# Patient Record
Sex: Male | Born: 1965 | Race: White | Hispanic: No | Marital: Married | Smoking: Former smoker
Health system: Southern US, Community
[De-identification: ages and names within clinical notes are randomized; demographics above are authoritative.]

## PROBLEM LIST (undated history)

## (undated) DIAGNOSIS — J449 Chronic obstructive pulmonary disease, unspecified: Secondary | ICD-10-CM

## (undated) DIAGNOSIS — I10 Essential (primary) hypertension: Secondary | ICD-10-CM

## (undated) DIAGNOSIS — K219 Gastro-esophageal reflux disease without esophagitis: Secondary | ICD-10-CM

## (undated) DIAGNOSIS — E7800 Pure hypercholesterolemia, unspecified: Secondary | ICD-10-CM

## (undated) DIAGNOSIS — I2510 Atherosclerotic heart disease of native coronary artery without angina pectoris: Secondary | ICD-10-CM

## (undated) DIAGNOSIS — I509 Heart failure, unspecified: Secondary | ICD-10-CM

## (undated) HISTORY — PX: CORONARY ANGIOPLASTY WITH STENT PLACEMENT: SHX49

## (undated) HISTORY — DX: Gastro-esophageal reflux disease without esophagitis: K21.9

---

## 2018-02-01 ENCOUNTER — Encounter: Payer: Self-pay | Admitting: Emergency Medicine

## 2018-02-01 ENCOUNTER — Ambulatory Visit
Admission: EM | Admit: 2018-02-01 | Discharge: 2018-02-01 | Disposition: A | Discharge status: Home / Self Care | Payer: Medicaid Other | Attending: Family Medicine | Admitting: Family Medicine

## 2018-02-01 ENCOUNTER — Ambulatory Visit (INDEPENDENT_AMBULATORY_CARE_PROVIDER_SITE_OTHER): Payer: Medicaid Other

## 2018-02-01 ENCOUNTER — Other Ambulatory Visit: Payer: Self-pay

## 2018-02-01 DIAGNOSIS — Z79899 Other long term (current) drug therapy: Secondary | ICD-10-CM | POA: Insufficient documentation

## 2018-02-01 DIAGNOSIS — Z888 Allergy status to other drugs, medicaments and biological substances status: Secondary | ICD-10-CM | POA: Diagnosis not present

## 2018-02-01 DIAGNOSIS — I110 Hypertensive heart disease with heart failure: Secondary | ICD-10-CM | POA: Diagnosis not present

## 2018-02-01 DIAGNOSIS — F17200 Nicotine dependence, unspecified, uncomplicated: Secondary | ICD-10-CM | POA: Diagnosis not present

## 2018-02-01 DIAGNOSIS — R0781 Pleurodynia: Secondary | ICD-10-CM | POA: Diagnosis not present

## 2018-02-01 DIAGNOSIS — J441 Chronic obstructive pulmonary disease with (acute) exacerbation: Secondary | ICD-10-CM

## 2018-02-01 DIAGNOSIS — R110 Nausea: Secondary | ICD-10-CM

## 2018-02-01 DIAGNOSIS — R079 Chest pain, unspecified: Secondary | ICD-10-CM

## 2018-02-01 DIAGNOSIS — I2510 Atherosclerotic heart disease of native coronary artery without angina pectoris: Secondary | ICD-10-CM | POA: Diagnosis not present

## 2018-02-01 DIAGNOSIS — Z7982 Long term (current) use of aspirin: Secondary | ICD-10-CM | POA: Diagnosis not present

## 2018-02-01 DIAGNOSIS — E7800 Pure hypercholesterolemia, unspecified: Secondary | ICD-10-CM | POA: Insufficient documentation

## 2018-02-01 DIAGNOSIS — J449 Chronic obstructive pulmonary disease, unspecified: Secondary | ICD-10-CM | POA: Diagnosis not present

## 2018-02-01 DIAGNOSIS — R0602 Shortness of breath: Secondary | ICD-10-CM

## 2018-02-01 DIAGNOSIS — Z955 Presence of coronary angioplasty implant and graft: Secondary | ICD-10-CM | POA: Diagnosis not present

## 2018-02-01 DIAGNOSIS — I509 Heart failure, unspecified: Secondary | ICD-10-CM | POA: Insufficient documentation

## 2018-02-01 HISTORY — DX: Essential (primary) hypertension: I10

## 2018-02-01 HISTORY — DX: Pure hypercholesterolemia, unspecified: E78.00

## 2018-02-01 HISTORY — DX: Atherosclerotic heart disease of native coronary artery without angina pectoris: I25.10

## 2018-02-01 HISTORY — DX: Heart failure, unspecified: I50.9

## 2018-02-01 HISTORY — DX: Chronic obstructive pulmonary disease, unspecified: J44.9

## 2018-02-01 LAB — COMPREHENSIVE METABOLIC PANEL
ALBUMIN: 4 g/dL (ref 3.5–5.0)
ALK PHOS: 166 U/L — AB (ref 38–126)
ALT: 25 U/L (ref 0–44)
AST: 23 U/L (ref 15–41)
Anion gap: 11 (ref 5–15)
BILIRUBIN TOTAL: 0.5 mg/dL (ref 0.3–1.2)
BUN: 20 mg/dL (ref 6–20)
CALCIUM: 9.3 mg/dL (ref 8.9–10.3)
CO2: 25 mmol/L (ref 22–32)
CREATININE: 1.05 mg/dL (ref 0.61–1.24)
Chloride: 100 mmol/L (ref 98–111)
GFR calc Af Amer: >60 mL/min (ref >60)
GFR calc non Af Amer: >60 mL/min (ref >60)
GLUCOSE: 137 mg/dL — AB (ref 70–99)
Potassium: 4 mmol/L (ref 3.5–5.1)
SODIUM: 136 mmol/L (ref 135–145)
TOTAL PROTEIN: 7.6 g/dL (ref 6.5–8.1)

## 2018-02-01 LAB — CBC WITH DIFFERENTIAL/PLATELET
ABS IMMATURE GRANULOCYTES: 0.02 10*3/uL (ref 0.00–0.07)
BASOS ABS: 0.1 10*3/uL (ref 0.0–0.1)
Basophils Relative: 1 %
EOS PCT: 2 %
Eosinophils Absolute: 0.1 10*3/uL (ref 0.0–0.5)
HEMATOCRIT: 48.8 % (ref 39.0–52.0)
Hemoglobin: 16.4 g/dL (ref 13.0–17.0)
Immature Granulocytes: 0 %
LYMPHS ABS: 1.8 10*3/uL (ref 0.7–4.0)
Lymphocytes Relative: 25 %
MCH: 26.5 pg (ref 26.0–34.0)
MCHC: 33.6 g/dL (ref 30.0–36.0)
MCV: 79 fL — ABNORMAL LOW (ref 80.0–100.0)
MONO ABS: 0.5 10*3/uL (ref 0.1–1.0)
Monocytes Relative: 7 %
NEUTROS ABS: 4.5 10*3/uL (ref 1.7–7.7)
NRBC: 0 % (ref 0.0–0.2)
Neutrophils Relative %: 65 %
Platelets: 202 10*3/uL (ref 150–400)
RBC: 6.18 MIL/uL — AB (ref 4.22–5.81)
RDW: 13.6 % (ref 11.5–15.5)
WBC: 6.9 10*3/uL (ref 4.0–10.5)

## 2018-02-01 MED ORDER — UMECLIDINIUM-VILANTEROL 62.5-25 MCG/INH IN AEPB: 1.0000 | INHALATION_SPRAY | Freq: Every day | RESPIRATORY_TRACT | 0 refills | Status: DC

## 2018-02-01 MED ORDER — PREDNISONE 20 MG PO TABS: 20.0000 mg | ORAL_TABLET | Freq: Every day | ORAL | 0 refills | Status: DC

## 2018-02-01 NOTE — ED Triage Notes (Signed)
Patient c/o chest pain that radiates into his back that started 2 weeks ago. Patient c/o shortness of breath, nausea, sweating with chest pain episodes.

## 2018-02-25 ENCOUNTER — Ambulatory Visit: Payer: Self-pay

## 2018-03-04 ENCOUNTER — Other Ambulatory Visit: Payer: Self-pay

## 2018-03-04 ENCOUNTER — Encounter: Payer: Self-pay | Admitting: Gerontology

## 2018-03-04 ENCOUNTER — Ambulatory Visit: Payer: Self-pay | Admitting: Gerontology

## 2018-03-04 VITALS — BP 155/103 | HR 71 | Ht 71.0 in | Wt 188.6 lb

## 2018-03-04 DIAGNOSIS — Z7689 Persons encountering health services in other specified circumstances: Secondary | ICD-10-CM

## 2018-03-04 DIAGNOSIS — Z720 Tobacco use: Secondary | ICD-10-CM

## 2018-03-04 DIAGNOSIS — J449 Chronic obstructive pulmonary disease, unspecified: Secondary | ICD-10-CM

## 2018-03-04 DIAGNOSIS — Z8719 Personal history of other diseases of the digestive system: Secondary | ICD-10-CM

## 2018-03-04 DIAGNOSIS — I509 Heart failure, unspecified: Secondary | ICD-10-CM

## 2018-03-04 DIAGNOSIS — E785 Hyperlipidemia, unspecified: Secondary | ICD-10-CM

## 2018-03-04 DIAGNOSIS — I10 Essential (primary) hypertension: Secondary | ICD-10-CM

## 2018-03-04 MED ORDER — BUMETANIDE 1 MG PO TABS: 1.0000 mg | ORAL_TABLET | Freq: Every day | ORAL | 3 refills | Status: AC

## 2018-03-04 MED ORDER — ATORVASTATIN CALCIUM 80 MG PO TABS: 80.0000 mg | ORAL_TABLET | Freq: Every day | ORAL | 0 refills | Status: DC

## 2018-03-04 MED ORDER — CLONIDINE HCL 0.2 MG PO TABS: 0.2000 mg | ORAL_TABLET | Freq: Two times a day (BID) | ORAL | 2 refills | Status: DC

## 2018-03-04 MED ORDER — NITROGLYCERIN 0.4 MG SL SUBL: SUBLINGUAL_TABLET | SUBLINGUAL | 2 refills | Status: DC

## 2018-03-04 MED ORDER — OMEPRAZOLE 40 MG PO CPDR: 40.0000 mg | DELAYED_RELEASE_CAPSULE | Freq: Every day | ORAL | 3 refills | Status: DC

## 2018-03-04 MED ORDER — ASPIRIN 81 MG PO CHEW: 81.0000 mg | CHEWABLE_TABLET | Freq: Every day | ORAL | 3 refills | Status: DC

## 2018-03-04 MED ORDER — UMECLIDINIUM-VILANTEROL 62.5-25 MCG/INH IN AEPB: 1.0000 | INHALATION_SPRAY | Freq: Every day | RESPIRATORY_TRACT | 0 refills | Status: AC

## 2018-03-04 MED ORDER — CLOPIDOGREL BISULFATE 75 MG PO TABS: 75.0000 mg | ORAL_TABLET | Freq: Every day | ORAL | 2 refills | Status: AC

## 2018-03-04 MED ORDER — CARVEDILOL 25 MG PO TABS: 25.0000 mg | ORAL_TABLET | Freq: Two times a day (BID) | ORAL | 2 refills | Status: DC

## 2018-03-04 MED ORDER — BENAZEPRIL HCL 40 MG PO TABS: 40.0000 mg | ORAL_TABLET | Freq: Every day | ORAL | 3 refills | Status: DC

## 2018-03-04 MED ORDER — ALBUTEROL SULFATE HFA 108 (90 BASE) MCG/ACT IN AERS: 2.0000 | INHALATION_SPRAY | RESPIRATORY_TRACT | 3 refills | Status: AC | PRN

## 2018-03-04 NOTE — Patient Instructions (Signed)
Smoking Tobacco Information Smoking tobacco will very likely harm your health. Tobacco contains a poisonous (toxic), colorless chemical called nicotine. Nicotine affects the brain and makes tobacco addictive. This change in your brain can make it hard to stop smoking. Tobacco also has other toxic chemicals that can hurt your body and raise your risk of many cancers. How can smoking tobacco affect me? Smoking tobacco can increase your chances of having serious health conditions, such as:  Cancer. Smoking is most commonly associated with lung cancer, but can lead to cancer in other parts of the body.  Chronic obstructive pulmonary disease (COPD). This is a long-term lung condition that makes it hard to breathe. It also gets worse over time.  High blood pressure (hypertension), heart disease, stroke, or heart attack.  Lung infections, such as pneumonia.  Cataracts. This is when the lenses in the eyes become clouded.  Digestive problems. This may include peptic ulcers, heartburn, and gastroesophageal reflux disease (GERD).  Oral health problems, such as gum disease and tooth loss.  Loss of taste and smell.  Smoking can affect your appearance by causing:  Wrinkles.  Yellow or stained teeth, fingers, and fingernails.  Smoking tobacco can also affect your social life.  Many workplaces, restaurants, hotels, and public places are tobacco-free. This means that you may experience challenges in finding places to smoke when away from home.  The cost of a smoking habit can be expensive. Expenses for someone who smokes come in two ways: ? You spend money on a regular basis to buy tobacco. ? Your health care costs in the long-term are higher if you smoke.  Tobacco smoke can also affect the health of those around you. Children of smokers have greater chances of: ? Sudden infant death syndrome (SIDS). ? Ear infections. ? Lung infections.  What lifestyle changes can be made?  Do not start  smoking. Quit if you already do.  To quit smoking: ? Make a plan to quit smoking and commit yourself to it. Look for programs to help you and ask your health care provider for recommendations and ideas. ? Talk with your health care provider about using nicotine replacement medicines to help you quit. Medicine replacement medicines include gum, lozenges, patches, sprays, or pills. ? Do not replace cigarette smoking with electronic cigarettes, which are commonly called e-cigarettes. The safety of e-cigarettes is not known, and some may contain harmful chemicals. ? Avoid places, people, or situations that tempt you to smoke. ? If you try to quit but return to smoking, don't give up hope. It is very common for people to try a number of times before they fully succeed. When you feel ready again, give it another try.  Quitting smoking might affect the way you eat as well as your weight. Be prepared to monitor your eating habits. Get support in planning and following a healthy diet.  Ask your health care provider about having regular tests (screenings) to check for cancer. This may include blood tests, imaging tests, and other tests.  Exercise regularly. Consider taking walks, joining a gym, or doing yoga or exercise classes.  Develop skills to manage your stress. These skills include meditation. What are the benefits of quitting smoking? By quitting smoking, you may:  Lower your risk of getting cancer and other diseases caused by smoking.  Live longer.  Breathe better.  Lower your blood pressure and heart rate.  Stop your addiction to tobacco.  Stop creating secondhand smoke that hurts other people.  Improve your   sense of taste and smell.  Look better over time, due to having fewer wrinkles and less staining.  What can happen if changes are not made? If you do not stop smoking, you may:  Get cancer and other diseases.  Develop COPD or other long-term (chronic) lung  conditions.  Develop serious problems with your heart and blood vessels (cardiovascular system).  Need more tests to screen for problems caused by smoking.  Have higher, long-term healthcare costs from medicines or treatments related to smoking.  Continue to have worsening changes in your lungs, mouth, and nose.  Where to find support: To get support to quit smoking, consider:  Asking your health care provider for more information and resources.  Taking classes to learn more about quitting smoking.  Looking for local organizations that offer resources about quitting smoking.  Joining a support group for people who want to quit smoking in your local community.  Where to find more information: You may find more information about quitting smoking from:  HelpGuide.org: www.helpguide.org/articles/addictions/how-to-quit-smoking.htm  Smokefree.gov: smokefree.gov  American Lung Association: www.lung.org  Contact a health care provider if:  You have problems breathing.  Your lips, nose, or fingers turn blue.  You have chest pain.  You are coughing up blood.  You feel faint or you pass out.  You have other noticeable changes that cause you to worry. Summary  Smoking tobacco can negatively affect your health, the health of those around you, your finances, and your social life.  Do not start smoking. Quit if you already do. If you need help quitting, ask your health care provider.  Think about joining a support group for people who want to quit smoking in your local community. There are many effective programs that will help you to quit this behavior. This information is not intended to replace advice given to you by your health care provider. Make sure you discuss any questions you have with your health care provider. Document Released: 03/25/2016 Document Revised: 03/25/2016 Document Reviewed: 03/25/2016 Elsevier Interactive Patient Education  2018 Else[employer] Inc[employer] DASH  Eating Plan DASH stands for "Dietary Approaches to Stop Hypertension." The DASH eating plan is a healthy eating plan that has been shown to reduce high blood pressure (hypertension). It may also reduce your risk for type 2 diabetes, heart disease, and stroke. The DASH eating plan may also help with weight loss. What are tips for following this plan? General guidelines  Avoid eating more than 2,300 mg (milligrams) of salt (sodium) a day. If you have hypertension, you may need to reduce your sodium intake to 1,500 mg a day.  Limit alcohol intake to no more than 1 drink a day for nonpregnant women and 2 drinks a day for men. One drink equals 12 oz of beer, 5 oz of wine, or 1 oz of hard liquor.  Work with your health care provider to maintain a healthy body weight or to lose weight. Ask what an ideal weight is for you.  Get at least 30 minutes of exercise that causes your heart to beat faster (aerobic exercise) most days of the week. Activities may include walking, swimming, or biking.  Work with your health care provider or diet and nutrition specialist (dietitian) to adjust your eating plan to your individual calorie needs. Reading food labels  Check food labels for the amount of sodium per serving. Choose foods with less than 5 percent of the Daily Value of sodium. Generally, foods with less than 300 mg of sodium per serving   fit into this eating plan.  To find whole grains, look for the word "whole" as the first word in the ingredient list. Shopping  Buy products labeled as "low-sodium" or "no salt added."  Buy fresh foods. Avoid canned foods and premade or frozen meals. Cooking  Avoid adding salt when cooking. Use salt-free seasonings or herbs instead of table salt or sea salt. Check with your health care provider or pharmacist before using salt substitutes.  Do not fry foods. Cook foods using healthy methods such as baking, boiling, grilling, and broiling instead.  Cook with  heart-healthy oils, such as olive, canola, soybean, or sunflower oil. Meal planning   Eat a balanced diet that includes: ? 5 or more servings of fruits and vegetables each day. At each meal, try to fill half of your plate with fruits and vegetables. ? Up to 6-8 servings of whole grains each day. ? Less than 6 oz of lean meat, poultry, or fish each day. A 3-oz serving of meat is about the same size as a deck of cards. One egg equals 1 oz. ? 2 servings of low-fat dairy each day. ? A serving of nuts, seeds, or beans 5 times each week. ? Heart-healthy fats. Healthy fats called Omega-3 fatty acids are found in foods such as flaxseeds and coldwater fish, like sardines, salmon, and mackerel.  Limit how much you eat of the following: ? Canned or prepackaged foods. ? Food that is high in trans fat, such as fried foods. ? Food that is high in saturated fat, such as fatty meat. ? Sweets, desserts, sugary drinks, and other foods with added sugar. ? Full-fat dairy products.  Do not salt foods before eating.  Try to eat at least 2 vegetarian meals each week.  Eat more home-cooked food and less restaurant, buffet, and fast food.  When eating at a restaurant, ask that your food be prepared with less salt or no salt, if possible. What foods are recommended? The items listed may not be a complete list. Talk with your dietitian about what dietary choices are best for you. Grains Whole-grain or whole-wheat bread. Whole-grain or whole-wheat pasta. Brown rice. Oatmeal. Quinoa. Bulgur. Whole-grain and low-sodium cereals. Pita bread. Low-fat, low-sodium crackers. Whole-wheat flour tortillas. Vegetables Fresh or frozen vegetables (raw, steamed, roasted, or grilled). Low-sodium or reduced-sodium tomato and vegetable juice. Low-sodium or reduced-sodium tomato sauce and tomato paste. Low-sodium or reduced-sodium canned vegetables. Fruits All fresh, dried, or frozen fruit. Canned fruit in natural juice (without  added sugar). Meat and other protein foods Skinless chicken or turkey. Ground chicken or turkey. Pork with fat trimmed off. Fish and seafood. Egg whites. Dried beans, peas, or lentils. Unsalted nuts, nut butters, and seeds. Unsalted canned beans. Lean cuts of beef with fat trimmed off. Low-sodium, lean deli meat. Dairy Low-fat (1%) or fat-free (skim) milk. Fat-free, low-fat, or reduced-fat cheeses. Nonfat, low-sodium ricotta or cottage cheese. Low-fat or nonfat yogurt. Low-fat, low-sodium cheese. Fats and oils Soft margarine without trans fats. Vegetable oil. Low-fat, reduced-fat, or light mayonnaise and salad dressings (reduced-sodium). Canola, safflower, olive, soybean, and sunflower oils. Avocado. Seasoning and other foods Herbs. Spices. Seasoning mixes without salt. Unsalted popcorn and pretzels. Fat-free sweets. What foods are not recommended? The items listed may not be a complete list. Talk with your dietitian about what dietary choices are best for you. Grains Baked goods made with fat, such as croissants, muffins, or some breads. Dry pasta or rice meal packs. Vegetables Creamed or fried vegetables. Vegetables in   a cheese sauce. Regular canned vegetables (not low-sodium or reduced-sodium). Regular canned tomato sauce and paste (not low-sodium or reduced-sodium). Regular tomato and vegetable juice (not low-sodium or reduced-sodium). Pickles. Olives. Fruits Canned fruit in a light or heavy syrup. Fried fruit. Fruit in cream or butter sauce. Meat and other protein foods Fatty cuts of meat. Ribs. Fried meat. Bacon. Sausage. Bologna and other processed lunch meats. Salami. Fatback. Hotdogs. Bratwurst. Salted nuts and seeds. Canned beans with added salt. Canned or smoked fish. Whole eggs or egg yolks. Chicken or turkey with skin. Dairy Whole or 2% milk, cream, and half-and-half. Whole or full-fat cream cheese. Whole-fat or sweetened yogurt. Full-fat cheese. Nondairy creamers. Whipped toppings.  Processed cheese and cheese spreads. Fats and oils Butter. Stick margarine. Lard. Shortening. Ghee. Bacon fat. Tropical oils, such as coconut, palm kernel, or palm oil. Seasoning and other foods Salted popcorn and pretzels. Onion salt, garlic salt, seasoned salt, table salt, and sea salt. Worcestershire sauce. Tartar sauce. Barbecue sauce. Teriyaki sauce. Soy sauce, including reduced-sodium. Steak sauce. Canned and packaged gravies. Fish sauce. Oyster sauce. Cocktail sauce. Horseradish that you find on the shelf. Ketchup. Mustard. Meat flavorings and tenderizers. Bouillon cubes. Hot sauce and Tabasco sauce. Premade or packaged marinades. Premade or packaged taco seasonings. Relishes. Regular salad dressings. Where to find more information:  National Heart, Lung, and Blood Institute: www.nhlbi.nih.gov  American Heart Association: www.heart.org Summary  The DASH eating plan is a healthy eating plan that has been shown to reduce high blood pressure (hypertension). It may also reduce your risk for type 2 diabetes, heart disease, and stroke.  With the DASH eating plan, you should limit salt (sodium) intake to 2,300 mg a day. If you have hypertension, you may need to reduce your sodium intake to 1,500 mg a day.  When on the DASH eating plan, aim to eat more fresh fruits and vegetables, whole grains, lean proteins, low-fat dairy, and heart-healthy fats.  Work with your health care provider or diet and nutrition specialist (dietitian) to adjust your eating plan to your individual calorie needs. This information is not intended to replace advice given to you by your health care provider. Make sure you discuss any questions you have with your health care provider. Document Released: 02/27/2011 Document Revised: 03/03/2016 Document Reviewed: 03/03/2016 Elsevier Interactive Patient Education  2018 Elsevier Inc.  

## 2018-03-04 NOTE — Progress Notes (Signed)
Patient: Thomas Bautista Male    DOB: 07-Nov-1965   52 y.o.   MRN: [id number] Visit Date: 03/04/2018  Today's Provider: Rolm Galahioma E Harlee Pursifull, NP   Chief Complaint  Patient presents with  . New Patient (Initial Visit)    establish primary care for high blood pressure, would like referral to cardiologist   Subjective:    HPI Thomas Bautista 52 y/o male presents for initial office visit and evaluate hypertension and medication refill. He was seen at the ED  for chest pain on 12/02/17, he has a history of CAD with 7 stents placed between 2015-2018, and continues to smoke 1 pack of cigarette a day and admits the desire to quit.  He denies chest pain, palpitations, dizziness and he monitors BP at home. He reports that he has being out of Lotensin for 2 weeks and clonidine for 2 days.  He c/o intermittent swelling to right foot, and discoloration.  He has a history of COPD and uses albuterol as needed.       Allergies  Allergen Reactions  . Furosemide Shortness Of Breath  . Spironolactone     Reported extreme tachycardia   Previous Medications   ALBUTEROL (PROVENTIL HFA;VENTOLIN HFA) 108 (90 BASE) MCG/ACT INHALER    Inhale into the lungs.   ASPIRIN 81 MG CHEWABLE TABLET    Chew by mouth.   ATORVASTATIN (LIPITOR) 80 MG TABLET    Take by mouth.   BENAZEPRIL (LOTENSIN) 40 MG TABLET    Take by mouth.   BUMETANIDE (BUMEX) 1 MG TABLET    Take by mouth.   CARVEDILOL (COREG) 25 MG TABLET    Take by mouth.   CLONIDINE (CATAPRES) 0.2 MG TABLET    Take by mouth.   CLOPIDOGREL (PLAVIX) 75 MG TABLET    Take 75 mg by mouth daily.   ESCITALOPRAM (LEXAPRO) 20 MG TABLET    Take by mouth.   NITROGLYCERIN (NITROSTAT) 0.4 MG SL TABLET    0.4 mg. Frequency:PHARMDIR   Dosage:0.4   MG  Instructions:  Note:Take 1 tab under the tongue for chest pain, you can take another after 5 minutes if not relieved, up to 3 tabs in 15 mins. If not relieved, call your doctor or go to the ER. Dose: 0.4 MG   OMEPRAZOLE (PRILOSEC)  40 MG CAPSULE    Take by mouth.   UMECLIDINIUM-VILANTEROL (ANORO ELLIPTA) 62.5-25 MCG/INH AEPB    Inhale 1 puff into the lungs daily.    Review of Systems  Constitutional: Negative.   HENT: Negative.   Eyes: Negative.   Respiratory: Negative.   Cardiovascular: Negative.   Gastrointestinal: Positive for constipation (reports doesn't drink water).  Genitourinary: Negative.   Musculoskeletal: Negative.   Skin: Negative.   Neurological: Negative.  Negative for dizziness and headaches.  Psychiatric/Behavioral: Negative.     Social History   Tobacco Use  . Smoking status: Current Every Day Smoker  . Smokeless tobacco: Never Used  Substance Use Topics  . Alcohol use: Never    Frequency: Never   Objective:   BP (!) 155/103 (BP Location: Right Arm, Patient Position: Sitting)   Pulse 71   Ht 5\' 11"  (1.803 m)   Wt 188 lb 9.6 oz (85.5 kg)   SpO2 97%   BMI 26.30 kg/m   Physical Exam Constitutional:      Appearance: Normal appearance.  HENT:     Head: Normocephalic and atraumatic.  Eyes:     Pupils: Pupils are equal, round, and reactive to light.  Neck:     Musculoskeletal: Normal range of motion.  Cardiovascular:     Rate and Rhythm: Normal rate and regular rhythm.     Pulses: Normal pulses.     Heart sounds: Normal heart sounds.  Pulmonary:     Effort: Pulmonary effort is normal.     Breath sounds: Normal breath sounds.  Abdominal:     General: Abdomen is flat. Bowel sounds are normal.     Palpations: Abdomen is soft.  Musculoskeletal: Normal range of motion.  Skin:    General: Skin is warm and dry.  Neurological:     General: No focal deficit present.     Mental Status: He is alert and oriented to person, place, and time.  Psychiatric:        Mood and Affect: Mood normal.        Behavior: Behavior normal.        Thought Content: Thought content normal.        Judgment: Judgment normal.         Assessment & Plan:     1. Essential hypertension - Mr.  Minion will continue on the following blood pressure medicine, he was encouraged to monitor and document BP, continue on low salt diet, increase water intake. - benazepril (LOTENSIN) 40 MG tablet; Take 1 tablet (40 mg total) by mouth daily.  Dispense: 30 tablet; Refill: 3 - bumetanide (BUMEX) 1 MG tablet; Take 1 tablet (1 mg total) by mouth daily.  Dispense: 30 tablet; Refill: 3 - carvedilol (COREG) 25 MG tablet; Take 1 tablet (25 mg total) by mouth 2 (two) times daily with a meal.  Dispense: 60 tablet; Refill: 2 - cloNIDine (CATAPRES) 0.2 MG tablet; Take 1 tablet (0.2 mg total) by mouth 2 (two) times daily.  Dispense: 60 tablet; Refill: 2 - clopidogrel (PLAVIX) 75 MG tablet; Take 1 tablet (75 mg total) by mouth daily.  Dispense: 30 tablet; Refill: 2  2. Hyperlipidemia, unspecified hyperlipidemia type -He was advised to continue on low fat low cholesterol diet. - atorvastatin (LIPITOR) 80 MG tablet; Take 1 tablet (80 mg total) by mouth daily at 6 PM.  Dispense: 30 tablet; Refill: 0 - Lipid Profile  3. Tobacco abuse disorder - He was strongly advised on smoking cessation  4. Congestive heart failure, unspecified HF chronicity, unspecified heart failure type (HCC) - Charity care application provided for Cardiology referral - aspirin 81 MG chewable tablet; Chew 1 tablet (81 mg total) by mouth daily.  Dispense: 30 tablet; Refill: 3 - nitroGLYCERIN (NITROSTAT) 0.4 MG SL tablet; 0.4 mg. Frequency:PHARMDIR   Dosage:0.4   MG  Instructions:  Note:Take 1 tab under the tongue for chest pain, you can take another after 5 minutes if not relieved, up to 3 tabs in 15 mins. If not relieved, call your doctor or go to the ER. Dose: 0.4 MG  Dispense: 30 tablet; Refill: 2  5. Encounter to establish care - He was strongly advised to increase water intake and fiber for constipation. - HgB A1c  6. 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  Dispense: 1 Inhaler; Refill: 3 - umeclidinium-vilanterol (ANORO ELLIPTA) 62.5-25 MCG/INH AEPB; Inhale 1 puff into the lungs daily.  Dispense: 1 each; Refill: 0  7. H/O gastroesophageal reflux (GERD)  - omeprazole (PRILOSEC) 40 MG capsule; Take 1  capsule (40 mg total) by mouth daily.  Dispense: 30 capsule; Refill: 3       Evolet Salminen Trellis Paganini, NP   [facility]

## 2018-03-08 ENCOUNTER — Ambulatory Visit: Payer: 59

## 2018-03-10 ENCOUNTER — Other Ambulatory Visit: Payer: Self-pay | Admitting: Gerontology

## 2018-03-10 ENCOUNTER — Other Ambulatory Visit: Payer: Self-pay

## 2018-03-10 DIAGNOSIS — I10 Essential (primary) hypertension: Secondary | ICD-10-CM

## 2018-03-10 DIAGNOSIS — I509 Heart failure, unspecified: Secondary | ICD-10-CM

## 2018-03-10 DIAGNOSIS — Z8719 Personal history of other diseases of the digestive system: Secondary | ICD-10-CM

## 2018-03-10 MED ORDER — NITROGLYCERIN 0.4 MG SL SUBL: SUBLINGUAL_TABLET | SUBLINGUAL | 2 refills | Status: DC

## 2018-03-10 MED ORDER — PANTOPRAZOLE SODIUM 40 MG PO TBEC: 40.0000 mg | DELAYED_RELEASE_TABLET | Freq: Every day | ORAL | 0 refills | Status: DC

## 2018-03-10 MED ORDER — NITROGLYCERIN 0.4 MG SL SUBL: SUBLINGUAL_TABLET | SUBLINGUAL | 2 refills | Status: AC

## 2018-03-12 ENCOUNTER — Ambulatory Visit: Payer: 59

## 2018-03-30 ENCOUNTER — Ambulatory Visit: Payer: Self-pay | Admitting: Pharmacy Technician

## 2018-03-30 DIAGNOSIS — Z79899 Other long term (current) drug therapy: Secondary | ICD-10-CM

## 2018-03-30 NOTE — Progress Notes (Signed)
Patient unable to find transportation.  Requested phone consult.  Read contract over phone.  Patient verbally accepted all aspects of [facility]'s contract.  Mailing contract for patient to sign and return to [facility].  Pt indicated that he had an [employer] through [place] when he lived in [place].  Policy ended when he moved to [facility]  No longer has any form of health insurance.    Referred patient for MTM.  Completing Albuterol PAP Application.  Mailing to patient and provider to obtain signature.  Once returned will submit to [employer].  Sherilyn Dacosta Care Manager [facility]

## 2018-04-02 ENCOUNTER — Other Ambulatory Visit: Payer: Self-pay

## 2018-04-02 DIAGNOSIS — E785 Hyperlipidemia, unspecified: Secondary | ICD-10-CM

## 2018-04-02 MED ORDER — ATORVASTATIN CALCIUM 80 MG PO TABS: 80.0000 mg | ORAL_TABLET | Freq: Every day | ORAL | 0 refills | Status: DC

## 2018-04-08 ENCOUNTER — Ambulatory Visit: Payer: Self-pay | Admitting: Gerontology

## 2018-04-11 NOTE — ED Provider Notes (Signed)
[facility]    CSN: [id number] Arrival date & time: 02/01/18  1128     History   Chief Complaint Chief Complaint  Patient presents with  . Chest Pain    HPI Thomas Bautista is a 53 y.o. male.   53 yo male with a c/o chest pain for the past 2 weeks. States pain is worse when taking deep breaths or with movement and feels short of breath and sweats when he experiences these episodes. Patient has a h/o COPD.   The history is provided by the patient.  Chest Pain    Past Medical History:  Diagnosis Date  . Congestive heart failure (CHF) (HCC)   . COPD (chronic obstructive pulmonary disease) (HCC)   . Coronary artery disease   . Hypercholesterolemia   . Hypertension     There are no active problems to display for this patient.   Past Surgical History:  Procedure Laterality Date  . CORONARY ANGIOPLASTY WITH STENT PLACEMENT     7 cardiac stents       Home Medications    Prior to Admission medications   Medication Sig Start Date End Date Taking? Authorizing Provider  escitalopram (LEXAPRO) 20 MG tablet Take by mouth.   Yes [provider]  albuterol (PROVENTIL HFA;VENTOLIN HFA) 108 (90 Base) MCG/ACT inhaler Inhale 2 puffs into the lungs every 4 (four) hours as needed for wheezing or shortness of breath. 03/04/18   Iloabachie, Chioma E, NP  aspirin 81 MG chewable tablet Chew 1 tablet (81 mg total) by mouth daily. 03/04/18   Iloabachie, Chioma E, NP  atorvastatin (LIPITOR) 80 MG tablet Take 1 tablet (80 mg total) by mouth daily at 6 PM. 04/02/18   Iloabachie, Chioma E, NP  benazepril (LOTENSIN) 40 MG tablet Take 1 tablet (40 mg total) by mouth daily. 03/04/18   Iloabachie, Chioma E, NP  bumetanide (BUMEX) 1 MG tablet Take 1 tablet (1 mg total) by mouth daily. 03/04/18   Iloabachie, Chioma E, NP  carvedilol (COREG) 25 MG tablet Take 1 tablet (25 mg total) by mouth 2 (two) times daily with a meal. 03/04/18   Iloabachie, Chioma E, NP  cloNIDine (CATAPRES)  0.2 MG tablet Take 1 tablet (0.2 mg total) by mouth 2 (two) times daily. 03/04/18   Iloabachie, Chioma E, NP  clopidogrel (PLAVIX) 75 MG tablet Take 1 tablet (75 mg total) by mouth daily. 03/04/18   Iloabachie, Chioma E, NP  nitroGLYCERIN (NITROSTAT) 0.4 MG SL tablet 0.4 mg. Frequency:PHARMDIR   Dosage:0.4   MG  Instructions:  Note:Take 1 tab under the tongue for chest pain, you can take another after 5 minutes if not relieved, up to 3 tabs in 15 mins. If not relieved, call your doctor or go to the ER. Dose: 0.4 MG 03/10/18   Iloabachie, Chioma E, NP  pantoprazole (PROTONIX) 40 MG tablet Take 1 tablet (40 mg total) by mouth daily. 03/10/18   Iloabachie, Chioma E, NP  umeclidinium-vilanterol (ANORO ELLIPTA) 62.5-25 MCG/INH AEPB Inhale 1 puff into the lungs daily. 03/04/18   Iloabachie, Chioma E, NP    Family History Family History  Problem Relation Age of Onset  . Cancer Mother   . Coronary artery disease Mother   . Coronary artery disease Father     Social History Social History   Tobacco Use  . Smoking status: Current Every Day Smoker  . Smokeless tobacco: Never Used  Substance Use Topics  . Alcohol use: Never    Frequency: Never  .  Drug use: Yes    Types: Marijuana    Comment: 1 month ago     Allergies   Furosemide and Spironolactone   Review of Systems Review of Systems  Cardiovascular: Positive for chest pain.     Physical Exam Triage Vital Signs ED Triage Vitals  Enc Vitals Group     BP 02/01/18 1147 (!) 148/104     Pulse Rate 02/01/18 1147 69     Resp 02/01/18 1147 18     Temp 02/01/18 1147 98.8 F (37.1 C)     Temp Source 02/01/18 1147 Oral     SpO2 02/01/18 1147 100 %     Weight 02/01/18 1139 170 lb (77.1 kg)     Height 02/01/18 1139 5\' 11"  (1.803 m)     Head Circumference --      Peak Flow --      Pain Score 02/01/18 1139 8     Pain Loc --      Pain Edu? --      Excl. in GC? --    No data found.  Updated Vital Signs BP (!) 148/104 (BP Location:  Left Arm)   Pulse 69   Temp 98.8 F (37.1 C) (Oral)   Resp 18   Ht 5\' 11"  (1.803 m)   Wt 77.1 kg   SpO2 100%   BMI 23.71 kg/m   Visual Acuity Right Eye Distance:   Left Eye Distance:   Bilateral Distance:    Right Eye Near:   Left Eye Near:    Bilateral Near:     Physical Exam Vitals signs and nursing note reviewed.  Constitutional:      General: He is not in acute distress.    Appearance: Normal appearance. He is well-developed. He is not ill-appearing, toxic-appearing or diaphoretic.  HENT:     Head: Normocephalic and atraumatic.     Mouth/Throat:     Pharynx: Uvula midline.     Tonsils: No tonsillar abscesses.  Neck:     Musculoskeletal: Normal range of motion and neck supple.     Thyroid: No thyromegaly.     Trachea: No tracheal deviation.  Cardiovascular:     Rate and Rhythm: Normal rate and regular rhythm.     Heart sounds: Normal heart sounds.  Pulmonary:     Effort: Pulmonary effort is normal. No respiratory distress.     Breath sounds: Normal breath sounds. No stridor. No wheezing, rhonchi or rales.  Chest:     Chest wall: No tenderness.  Abdominal:     General: Bowel sounds are normal. There is no distension.     Palpations: Abdomen is soft.     Tenderness: There is no abdominal tenderness.  Lymphadenopathy:     Cervical: No cervical adenopathy.  Skin:    General: Skin is warm and dry.     Findings: No rash.  Neurological:     Mental Status: He is alert.      UC Treatments / Results  Labs (all labs ordered are listed, but only abnormal results are displayed) Labs Reviewed  CBC WITH DIFFERENTIAL/PLATELET - Abnormal; Notable for the following components:      Result Value   RBC 6.18 (*)    MCV 79.0 (*)    All other components within normal limits  COMPREHENSIVE METABOLIC PANEL - Abnormal; Notable for the following components:   Glucose, Bld 137 (*)    Alkaline Phosphatase 166 (*)    All other components within normal limits  EKG None  Radiology No results found.  Procedures Procedures (including critical care time)  Medications Ordered in UC Medications - No data to display  Initial Impression / Assessment and Plan / UC Course  I have reviewed the triage vital signs and the nursing notes.  Pertinent labs & imaging results that were available during my care of the patient were reviewed by me and considered in my medical decision making (see chart for details).      Final Clinical Impressions(s) / UC Diagnoses   Final diagnoses:  Pleuritic chest pain  COPD exacerbation [facility](HCC)    ED Prescriptions    Medication Sig Dispense Auth. Provider   umeclidinium-vilanterol (ANORO ELLIPTA) 62.5-25 MCG/INH AEPB Inhale 1 puff into the lungs daily. 1 each Payton Mccallumonty, Ashira Kirsten, MD   predniSONE (DELTASONE) 20 MG tablet Take 1 tablet (20 mg total) by mouth daily. 7 tablet Payton Mccallumonty, Parsa Rickett, MD     1. Labs/x-ray results and diagnosis reviewed with patient 2. rx as per orders above; reviewed possible side effects, interactions, risks and benefits  3. Recommend supportive treatment with otc analgesics prn  4. Follow-up with PCP  5. Follow up prn if symptoms worsen or don't improve   Controlled Substance Prescriptions [place] Controlled Substance Registry consulted? Not Applicable   Payton Mccallumonty, Natalin Bible, MD 04/11/18 1655

## 2018-04-16 ENCOUNTER — Ambulatory Visit: Payer: 59 | Admitting: Pharmacist

## 2018-04-16 ENCOUNTER — Encounter: Payer: Self-pay | Admitting: Pharmacist

## 2018-04-16 ENCOUNTER — Other Ambulatory Visit: Payer: Self-pay

## 2018-04-16 VITALS — BP 150/90 | Wt 198.0 lb

## 2018-04-16 DIAGNOSIS — Z79899 Other long term (current) drug therapy: Secondary | ICD-10-CM

## 2018-04-16 NOTE — Progress Notes (Signed)
Medication Management Clinic Visit Note  Patient: Thomas Bautista MRN: [id number] Date of Birth: 05/08/1965 PCP: Patient, No Pcp Per   Gasper Sells 53 y.o. male presents for a medication management visit today.  BP (!) 150/90 (BP Location: Right Arm, Patient Position: Sitting, Cuff Size: Normal)   Wt 198 lb (89.8 kg)   BMI 27.62 kg/m   Patient Information   Past Medical History:  Diagnosis Date  . Congestive heart failure (CHF) (HCC)   . COPD (chronic obstructive pulmonary disease) (HCC)   . Coronary artery disease   . GERD (gastroesophageal reflux disease)   . Hypercholesterolemia   . Hypertension       Past Surgical History:  Procedure Laterality Date  . CORONARY ANGIOPLASTY WITH STENT PLACEMENT     7 cardiac stents     Family History  Problem Relation Age of Onset  . Cancer Mother   . Coronary artery disease Mother   . Coronary artery disease Father     Family Support: Good  Lifestyle Diet: Breakfast: cup of coffee Lunch: sandwich Dinner: cook (meat, vegetables) Drinks: sweet tea    Current Exercise Habits: The patient does not participate in regular exercise at present       Social History   Substance and Sexual Activity  Alcohol Use Never  . Frequency: Never      Social History   Tobacco Use  Smoking Status Current Every Day Smoker  . Packs/day: 1.00  Smokeless Tobacco Never Used  Tobacco Comment   Cutting back      Health Maintenance  Topic Date Due  . HIV Screening  06/14/1979  . TETANUS/TDAP  06/14/1983  . COLONOSCOPY  06/14/2014  . INFLUENZA VACCINE  Completed   Outpatient Encounter Medications as of 04/16/2018  Medication Sig  . albuterol (PROVENTIL HFA;VENTOLIN HFA) 108 (90 Base) MCG/ACT inhaler Inhale 2 puffs into the lungs every 4 (four) hours as needed for wheezing or shortness of breath.  Marland Kitchen aspirin 81 MG chewable tablet Chew 1 tablet (81 mg total) by mouth daily.  Marland Kitchen atorvastatin (LIPITOR) 80 MG tablet Take 1 tablet (80  mg total) by mouth daily at 6 PM.  . benazepril (LOTENSIN) 40 MG tablet Take 1 tablet (40 mg total) by mouth daily.  . bumetanide (BUMEX) 1 MG tablet Take 1 tablet (1 mg total) by mouth daily.  . carvedilol (COREG) 25 MG tablet Take 1 tablet (25 mg total) by mouth 2 (two) times daily with a meal.  . cloNIDine (CATAPRES) 0.2 MG tablet Take 1 tablet (0.2 mg total) by mouth 2 (two) times daily.  . clopidogrel (PLAVIX) 75 MG tablet Take 1 tablet (75 mg total) by mouth daily.  Marland Kitchen escitalopram (LEXAPRO) 20 MG tablet Take by mouth.  . isosorbide mononitrate (IMDUR) 30 MG 24 hr tablet Take 30 mg by mouth daily.  . nitroGLYCERIN (NITROSTAT) 0.4 MG SL tablet 0.4 mg. Frequency:PHARMDIR   Dosage:0.4   MG  Instructions:  Note:Take 1 tab under the tongue for chest pain, you can take another after 5 minutes if not relieved, up to 3 tabs in 15 mins. If not relieved, call your doctor or go to the ER. Dose: 0.4 MG  . omeprazole (PRILOSEC) 40 MG capsule Take 40 mg by mouth daily.  Marland Kitchen umeclidinium-vilanterol (ANORO ELLIPTA) 62.5-25 MCG/INH AEPB Inhale 1 puff into the lungs daily.  . [DISCONTINUED] pantoprazole (PROTONIX) 40 MG tablet Take 1 tablet (40 mg total) by mouth daily.   No facility-administered encounter medications on file as of  04/16/2018.    Health Maintenance/Date Completed  Last ED visit: September Last Visit to PCP: this month Next Visit to PCP: February Specialist Visit: cardiology Dental Exam: not in last 10 years Eye Exam: no Prostate Exam: no Pelvic/PAP Exam: NA Mammogram: NA DEXA: NA Colonoscopy: next month Flu Vaccine: yes Pneumonia Vaccine: yes   Assessment and Plan: HTN: benazepril, clonidine, bumetanide, carvedilol, isosorbide. Can tell when his BP is high, gets a tightness/pain in his head and neck. He checks his BP TID per request by PCP. Says it runs high (160s-190s). BP today was elevated. I asked him if his doctor gave him a BP "number" for when he needed to go to the ED. He  said yes but he doesn't do that. We discussed the implications of high blood pressure, including stroke. He does continue to smoke but has cut back.  HF: benazepril, bumetanide, carvedilol. Does notice swelling in legs but bumetanide does work to keep fluid off. Does not report any other issues. Has seen a cardiologist recently.  HLD: atorvastatin. Does not report any issues. Has not had lipid panel checked in a while.  COPD: Anoro and albuterol. Does not have Anoro right now. Uses albuterol once a day which seems to help. Admits he needs to stop smoking. Reports that PCP was going to switch him to Spiriva because he could not afford Anoro. [facility]. Had him fill out paperwork today to send to provider and PAP.  GERD: omeprazole. Does not have any symptoms. But does notice when he is out of it, he wakes up with symptoms (sick to stomach). No other issues.  Smoking cessation: has cut back to 1 PPD. Admits he needs to quit for his health, especially to help with his HTN and COPD. He is not working so he said being at home all day makes him smoke more. We discussed options to stop. He says he has the Nicotrol inhaler but that it doesn't really work for him. I spoke to him about the patches and gum or lozenges and that these could be used together. I also informed him that there are medications that he could take and to speak to his PCP if he would like to try one of those.  Compliance: does not miss doses. His significant other was with him today and she takes care of his medications for him. It was evident that he relies on her and that he is very appreciative of her help.  I will have him return in 6 months to follow up on getting his inhaler for him. After that, I think it will be reasonable to follow up yearly.  Pricilla RiffleAbby K Ellington, PharmD Pharmacy Resident

## 2018-04-19 ENCOUNTER — Telehealth: Payer: Self-pay | Admitting: Pharmacist

## 2018-04-19 NOTE — Telephone Encounter (Signed)
04/19/2018 3:22:23 PM - Ventolin faxed to [facility]  04/19/2018 Faxed [facility] application for enrollment on Ventolin HFA.Forde Radon

## 2018-04-21 ENCOUNTER — Other Ambulatory Visit: Payer: Self-pay | Admitting: Gerontology

## 2018-04-21 DIAGNOSIS — E785 Hyperlipidemia, unspecified: Secondary | ICD-10-CM

## 2018-04-21 DIAGNOSIS — I10 Essential (primary) hypertension: Secondary | ICD-10-CM

## 2018-04-21 DIAGNOSIS — Z8719 Personal history of other diseases of the digestive system: Secondary | ICD-10-CM

## 2018-05-04 ENCOUNTER — Telehealth: Payer: Self-pay | Admitting: Pharmacist

## 2018-05-04 NOTE — Telephone Encounter (Signed)
05/04/2018 8:26:29 AM - Kizzie Fantasia Ellipta refill  05/04/2018 Faxed [facility] script for refill on Anora Ellipta 62.5/25mcg Inhale 1 puff into the lungs once a day, every day.Forde Radon

## 2018-05-05 MED ORDER — MELATONIN 3 MG PO TABS
3.00 | ORAL_TABLET | ORAL | Status: DC
Start: ? — End: 2018-05-05

## 2018-05-05 MED ORDER — CLONIDINE HCL 0.1 MG PO TABS
.20 | ORAL_TABLET | ORAL | Status: DC
Start: 2018-05-05 — End: 2018-05-05

## 2018-05-05 MED ORDER — BUMETANIDE 1 MG PO TABS
1.00 | ORAL_TABLET | ORAL | Status: DC
Start: 2018-05-06 — End: 2018-05-05

## 2018-05-05 MED ORDER — CLOPIDOGREL BISULFATE 75 MG PO TABS
75.00 | ORAL_TABLET | ORAL | Status: DC
Start: 2018-05-06 — End: 2018-05-05

## 2018-05-05 MED ORDER — ATORVASTATIN CALCIUM 80 MG PO TABS
80.00 | ORAL_TABLET | ORAL | Status: DC
Start: 2018-05-05 — End: 2018-05-05

## 2018-05-05 MED ORDER — UMECLIDINIUM BROMIDE 62.5 MCG/INH IN AEPB
1.00 | INHALATION_SPRAY | RESPIRATORY_TRACT | Status: DC
Start: 2018-05-06 — End: 2018-05-05

## 2018-05-05 MED ORDER — ALBUTEROL SULFATE HFA 108 (90 BASE) MCG/ACT IN AERS
2.00 | INHALATION_SPRAY | RESPIRATORY_TRACT | Status: DC
Start: ? — End: 2018-05-05

## 2018-05-05 MED ORDER — ASPIRIN 81 MG PO CHEW
81.00 | CHEWABLE_TABLET | ORAL | Status: DC
Start: 2018-05-06 — End: 2018-05-05

## 2018-05-05 MED ORDER — ISOSORBIDE MONONITRATE ER 30 MG PO TB24
30.00 | ORAL_TABLET | ORAL | Status: DC
Start: 2018-05-06 — End: 2018-05-05

## 2018-05-05 MED ORDER — PANTOPRAZOLE SODIUM 40 MG PO TBEC
40.00 | DELAYED_RELEASE_TABLET | ORAL | Status: DC
Start: 2018-05-06 — End: 2018-05-05

## 2018-05-05 MED ORDER — GENERIC EXTERNAL MEDICATION
Status: DC
Start: ? — End: 2018-05-05

## 2018-05-05 MED ORDER — LISINOPRIL 40 MG PO TABS
40.00 | ORAL_TABLET | ORAL | Status: DC
Start: 2018-05-05 — End: 2018-05-05

## 2018-05-05 MED ORDER — ONDANSETRON 4 MG PO TBDP
4.00 | ORAL_TABLET | ORAL | Status: DC
Start: ? — End: 2018-05-05

## 2018-05-05 MED ORDER — NITROGLYCERIN 0.4 MG SL SUBL
.40 | SUBLINGUAL_TABLET | SUBLINGUAL | Status: DC
Start: ? — End: 2018-05-05

## 2018-05-05 MED ORDER — ESCITALOPRAM OXALATE 10 MG PO TABS
20.00 | ORAL_TABLET | ORAL | Status: DC
Start: 2018-05-06 — End: 2018-05-05

## 2018-05-05 MED ORDER — MORPHINE SULFATE 2 MG/ML IJ SOLN
2.00 | INTRAMUSCULAR | Status: DC
Start: ? — End: 2018-05-05

## 2018-05-05 MED ORDER — CARVEDILOL 25 MG PO TABS
25.00 | ORAL_TABLET | ORAL | Status: DC
Start: 2018-05-05 — End: 2018-05-05

## 2018-05-05 MED ORDER — ACETAMINOPHEN 325 MG PO TABS
650.00 | ORAL_TABLET | ORAL | Status: DC
Start: ? — End: 2018-05-05

## 2018-05-11 ENCOUNTER — Encounter

## 2018-05-11 ENCOUNTER — Other Ambulatory Visit: Payer: Self-pay | Admitting: Gerontology

## 2018-05-11 ENCOUNTER — Ambulatory Visit: Payer: 59 | Admitting: Cardiovascular Disease

## 2018-05-11 DIAGNOSIS — I10 Essential (primary) hypertension: Secondary | ICD-10-CM

## 2018-05-13 ENCOUNTER — Encounter: Payer: Self-pay | Admitting: Cardiovascular Disease

## 2018-05-17 ENCOUNTER — Other Ambulatory Visit: Payer: Self-pay | Admitting: Gerontology

## 2018-05-17 DIAGNOSIS — I10 Essential (primary) hypertension: Secondary | ICD-10-CM

## 2018-05-31 ENCOUNTER — Telehealth: Payer: Self-pay | Admitting: Pharmacist

## 2018-05-31 NOTE — Telephone Encounter (Signed)
05/31/2018 1:34:14 PM - Ventolin refill  05/31/2018 Placed refill online with [facility] for Ventolin to ship 07/16/2018, order#[id number].Forde Radon

## 2018-07-05 ENCOUNTER — Other Ambulatory Visit: Payer: Self-pay | Admitting: Gerontology

## 2018-07-05 DIAGNOSIS — E785 Hyperlipidemia, unspecified: Secondary | ICD-10-CM

## 2018-07-05 DIAGNOSIS — I509 Heart failure, unspecified: Secondary | ICD-10-CM

## 2018-07-30 ENCOUNTER — Other Ambulatory Visit: Payer: Self-pay | Admitting: Gerontology

## 2018-07-30 DIAGNOSIS — I10 Essential (primary) hypertension: Secondary | ICD-10-CM

## 2018-08-04 ENCOUNTER — Telehealth: Payer: Self-pay | Admitting: Pharmacist

## 2018-08-04 NOTE — Telephone Encounter (Signed)
08/04/2018 1:43:33 PM - Kizzie Fantasia Ellipta refill  08/04/2018 Placed refill online with [facility] for Simonne Maffucci, to ship 08/18/2018, order# [id number].Forde Radon

## 2018-08-06 ENCOUNTER — Other Ambulatory Visit: Payer: Self-pay | Admitting: Gerontology

## 2018-08-06 DIAGNOSIS — I10 Essential (primary) hypertension: Secondary | ICD-10-CM

## 2018-08-11 ENCOUNTER — Other Ambulatory Visit: Payer: Self-pay | Admitting: Gerontology

## 2018-08-11 DIAGNOSIS — I10 Essential (primary) hypertension: Secondary | ICD-10-CM

## 2018-08-19 ENCOUNTER — Telehealth: Payer: Self-pay | Admitting: Pharmacist

## 2018-08-19 NOTE — Telephone Encounter (Signed)
08/19/2018 9:25:34 AM - Ventolin HFA refill  08/19/2018 Placed refill online with [facility] for Ventolin HFA, to ship 09/16/2018, Order# [id number].Forde Radon

## 2018-09-09 ENCOUNTER — Telehealth: Payer: Self-pay | Admitting: Pharmacist

## 2018-09-09 NOTE — Telephone Encounter (Signed)
09/09/2018 8:39:37 AM - Dexilant refill  09/09/2018 Called [facility] for refill on Dexilant 60mg .Delos Haring

## 2018-09-13 ENCOUNTER — Other Ambulatory Visit: Payer: Self-pay | Admitting: Gerontology

## 2018-09-13 DIAGNOSIS — E785 Hyperlipidemia, unspecified: Secondary | ICD-10-CM

## 2018-09-29 ENCOUNTER — Telehealth: Payer: Self-pay | Admitting: Pharmacist

## 2018-09-29 NOTE — Telephone Encounter (Signed)
09/29/2018 8:45:42 AM - Elliot Gault Ellipta refill  09/29/2018 Placed refill online 09/28/2018 with [facility] for Cammie Sickle, to ship 10/29/2018, order# [id number].Delos Haring

## 2018-10-21 ENCOUNTER — Encounter: Payer: Self-pay | Admitting: Pharmacist

## 2018-10-21 ENCOUNTER — Ambulatory Visit: Payer: 59 | Admitting: Pharmacist

## 2018-10-21 DIAGNOSIS — Z79899 Other long term (current) drug therapy: Secondary | ICD-10-CM

## 2018-10-21 NOTE — Progress Notes (Signed)
Medication Management Clinic Phone Visit Note  Patient: Thomas Bautista MRN: [id number] Date of Birth: 10-Jun-1965 PCP: Patient, No Pcp Per   Darleen Crocker 53 y.o. male was contacted for a medication therapy management visit/outreach call over the phone. Two patient identifiers were used to verify patient's identity.   There were no vitals taken for this visit.  Patient Information   Past Medical History:  Diagnosis Date  . Congestive heart failure (CHF) ([place])   . COPD (chronic obstructive pulmonary disease) ([place])   . Coronary artery disease   . GERD (gastroesophageal reflux disease)   . Hypercholesterolemia   . Hypertension       Past Surgical History:  Procedure Laterality Date  . CORONARY ANGIOPLASTY WITH STENT PLACEMENT     7 cardiac stents     Family History  Problem Relation Age of Onset  . Cancer Mother   . Coronary artery disease Mother   . Coronary artery disease Father     New Diagnoses (since last visit): N/A  Family Support: Good, wife helps with all meds  Lifestyle Diet: Breakfast: coffee Lunch: does not eat Dinner: meat, starch, vegetable Drinks: sweat tea only (no water) Exercise: pt reports engaging in yard work frequently and endorses an active lifestyle   Social History   Substance and Sexual Activity  Alcohol Use Never  . Frequency: Never      Social History   Tobacco Use  Smoking Status Current Every Day Smoker  . Packs/day: 0.50  Smokeless Tobacco Never Used  Tobacco Comment   Cutting back      Health Maintenance  Topic Date Due  . HIV Screening  06/14/1979  . TETANUS/TDAP  06/14/1983  . COLONOSCOPY  06/14/2014  . INFLUENZA VACCINE  10/23/2018   Outpatient Encounter Medications as of 10/21/2018  Medication Sig  . albuterol (PROVENTIL HFA;VENTOLIN HFA) 108 (90 Base) MCG/ACT inhaler Inhale 2 puffs into the lungs every 4 (four) hours as needed for wheezing or shortness of breath.  Marland Kitchen amLODipine (NORVASC) 5 MG tablet Take 5 mg  by mouth daily.  Marland Kitchen aspirin 81 MG chewable tablet CHEW 1 TABLET BY MOUTH EVERY DAY  . atorvastatin (LIPITOR) 80 MG tablet TAKE ONE TABLET BY MOUTH EVERY DAY AT 6:00pm  . benazepril (LOTENSIN) 20 MG tablet TAKE TWO TABLETS (40MG ) BY MOUTH EVERY DAY  . bumetanide (BUMEX) 1 MG tablet Take 1 tablet (1 mg total) by mouth daily.  . carvedilol (COREG) 25 MG tablet TAKE ONE TABLET BY MOUTH 2 TIMES A DAY WITH A MEAL  . cloNIDine (CATAPRES) 0.2 MG tablet TAKE ONE TABLET BY MOUTH 2 TIMES A DAY (Patient taking differently: Take 0.1 mg by mouth daily. )  . clopidogrel (PLAVIX) 75 MG tablet Take 1 tablet (75 mg total) by mouth daily.  Marland Kitchen dexlansoprazole (DEXILANT) 60 MG capsule Take 60 mg by mouth daily.  . nitroGLYCERIN (NITROSTAT) 0.4 MG SL tablet 0.4 mg. Frequency:PHARMDIR   Dosage:0.4   MG  Instructions:  Note:Take 1 tab under the tongue for chest pain, you can take another after 5 minutes if not relieved, up to 3 tabs in 15 mins. If not relieved, call your doctor or go to the ER. Dose: 0.4 MG  . sucralfate (CARAFATE) 1 g tablet Take 1 g by mouth 4 (four) times daily.  . traZODone (DESYREL) 100 MG tablet Take 100 mg by mouth at bedtime.  Marland Kitchen umeclidinium-vilanterol (ANORO ELLIPTA) 62.5-25 MCG/INH AEPB Inhale 1 puff into the lungs daily.  . isosorbide mononitrate (IMDUR) 60  MG 24 hr tablet Take 180 mg by mouth daily.   . [DISCONTINUED] escitalopram (LEXAPRO) 20 MG tablet Take by mouth.  . [DISCONTINUED] omeprazole (PRILOSEC) 40 MG capsule Take 40 mg by mouth daily.  . [DISCONTINUED] pantoprazole (PROTONIX) 40 MG tablet TAKE ONE TABLET BY MOUTH EVERY DAY (Patient not taking: Reported on 10/21/2018)   No facility-administered encounter medications on file as of 10/21/2018.      Assessment and Plan:  1. Adherence Pt's wife helps with compliance to all medications and pt deferred most questions to her while on the phone. Pt's wife was well versed in medication's indication, dose, strength, and frequency but did  struggle with medication name. Pt reports never missing medications due to wife and rx fill hx indicates compliance.   2. Chest Pain, hx of CAD, MI  Hx of chest pains, CAD, MI and 7 stents placed on aspirin 81mg  daily, clopidogrel 75mg  daily, isosorbide mono ER 180mg  daily, and nitroglycerin SL PRN. Pt reports needing to use NTG both of the past 2 days and constant nausea and pain complicated by GERD. Pt is followed by cardiology although pt reports frustration over cancelled appts. Pt reports being fitted for cardiac monitor. Pt knows to seek medical attention if chest pain is not relieved by NTG x3.   3. HTN, HF Hx of HTN and HF on benazepril 40mg  daily, carvedilol 25mg  BID, bumetanide 1mg  daily, amlodipine 5mg  daily, and tapering off of clonidine. LVEF (11/2017) 55%, BP readings avg 140's/90's. Pt reported adverse reactions to furosemide, spironolactone. Pt has BP cuff at home and takes BP twice daily per cardio recs. Pt states his BP is still elevated but has improved.  4. COPD Hx of COPD on Anoro and albuterol PRN. Pt reports much improved COPD control with Anoro addition with only needing albuterol 2x per week.   5. Hyperlipidemia Hx of hyperlipidemia on atorvastatin 80mg  daily. Pt reports no issues and is on appropriate high intensity statin.   6. Smoking cessation Hx of smoking currently smoking 0.5 ppd. Pt reports significantly cutting back on smoking recently with help of wife and grandchildren. Wife is scheduled for surgery soon and is attempted to quit smoking so pt is attempting to quit with her. Discussed strategies and support system for smoking cessation and pt sounded optimistic. Recommended pt reach out to 1-800-QUIT line to inquire about nicotine patches.   7. GERD Hx of GERD recently prescribed sucralfate 1g four times daily and Dexilant 60mg  daily. Pt previously on pantoprazole. Pt reports pain and nausea that is worse every morning. Symptoms are complicated by pt's frequent  chest pain. Recommended pt F/U with PCP regarding symptoms.   RTC: 1y (10/21/2019)  Wess BottsEmma Tavon Magnussen, PharmD Candidate [facility] [employer]  Cosigned by: Denice Paradisehristan Holt, PharmD, RPh Medication Management Clinic [facility] [phone]

## 2018-10-22 ENCOUNTER — Other Ambulatory Visit: Payer: Self-pay

## 2018-10-25 ENCOUNTER — Other Ambulatory Visit: Payer: Self-pay

## 2018-10-25 ENCOUNTER — Telehealth: Payer: Self-pay | Admitting: Pharmacist

## 2018-10-25 NOTE — Telephone Encounter (Signed)
Ventolin refill online with [facility]:  10/25/2018 Placed refill online with [facility] for Ventolin HFA, to ship 11/19/2018, Order# [id number].Thomas Bautista

## 2018-12-01 ENCOUNTER — Other Ambulatory Visit: Payer: Self-pay | Admitting: Gerontology

## 2018-12-01 DIAGNOSIS — I509 Heart failure, unspecified: Secondary | ICD-10-CM

## 2018-12-02 ENCOUNTER — Telehealth: Payer: Self-pay | Admitting: Pharmacist

## 2018-12-02 NOTE — Telephone Encounter (Signed)
12/02/2018 12:16:04 PM - Jearl Klinefelter Ellipta refill  12/02/2018 Placed refill online with [facility] for Anora Ellipta to ship 12/30/2018, order# [id number].Delos Haring

## 2019-03-07 ENCOUNTER — Telehealth: Payer: Self-pay | Admitting: Pharmacist

## 2019-03-07 NOTE — Telephone Encounter (Signed)
03/07/2019 11:35:00 AM - [facility] renewal for Ventolin & Elliot Gault Ellipta  03/07/2019 Faxed [facility] renewal application for Ventolin HFA 71mcg Inhale 2 puffs every 4 hours as needed for shortness of breath or wheezing, and Anoro Ellipta 62.5/25mcg Inhale 1 puff into the lungs once a day every day.Thomas Bautista

## 2019-04-04 ENCOUNTER — Telehealth: Payer: Self-pay | Admitting: Pharmacist

## 2019-04-04 NOTE — Telephone Encounter (Signed)
04/04/2019 12:10:34 PM - Dexilant call to Raelene Bott  04/04/2019 Called [facility] to place refill for Dexilant 60mg , allow 10-14 days, according to invoice this is patient's last refill. 

## 2019-05-27 ENCOUNTER — Telehealth: Payer: Self-pay | Admitting: Pharmacist

## 2019-05-27 NOTE — Telephone Encounter (Signed)
05/27/2019 3:38:19 PM - Ventolin refill online with [facility]  -- Rhetta Mura - Friday, May 27, 2019 3:37 PM -- Placed refill online with [facility] for Ventolin, to ship 06/09/2019, order# [id number].

## 2019-06-16 ENCOUNTER — Telehealth: Payer: Self-pay | Admitting: Pharmacist

## 2019-06-16 NOTE — Telephone Encounter (Signed)
Rhetta Mura - Thursday, June 16, 2019 4:09 PM -- Placed refill online with [facility] for Simonne Maffucci, to ship 06/29/2019, Order# [id number].

## 2019-07-18 ENCOUNTER — Emergency Department: Payer: 59

## 2019-07-18 ENCOUNTER — Emergency Department
Admission: EM | Admit: 2019-07-18 | Discharge: 2019-07-18 | Disposition: A | Discharge status: Home / Self Care | Payer: 59 | Attending: Emergency Medicine | Admitting: Emergency Medicine

## 2019-07-18 ENCOUNTER — Other Ambulatory Visit: Payer: Self-pay

## 2019-07-18 DIAGNOSIS — W19XXXA Unspecified fall, initial encounter: Secondary | ICD-10-CM

## 2019-07-18 DIAGNOSIS — R109 Unspecified abdominal pain: Secondary | ICD-10-CM | POA: Insufficient documentation

## 2019-07-18 DIAGNOSIS — W11XXXA Fall on and from ladder, initial encounter: Secondary | ICD-10-CM | POA: Insufficient documentation

## 2019-07-18 DIAGNOSIS — Y929 Unspecified place or not applicable: Secondary | ICD-10-CM | POA: Insufficient documentation

## 2019-07-18 DIAGNOSIS — Y939 Activity, unspecified: Secondary | ICD-10-CM | POA: Insufficient documentation

## 2019-07-18 DIAGNOSIS — I509 Heart failure, unspecified: Secondary | ICD-10-CM | POA: Insufficient documentation

## 2019-07-18 DIAGNOSIS — I2510 Atherosclerotic heart disease of native coronary artery without angina pectoris: Secondary | ICD-10-CM | POA: Insufficient documentation

## 2019-07-18 DIAGNOSIS — Y999 Unspecified external cause status: Secondary | ICD-10-CM | POA: Insufficient documentation

## 2019-07-18 DIAGNOSIS — J449 Chronic obstructive pulmonary disease, unspecified: Secondary | ICD-10-CM | POA: Insufficient documentation

## 2019-07-18 DIAGNOSIS — Z79899 Other long term (current) drug therapy: Secondary | ICD-10-CM | POA: Insufficient documentation

## 2019-07-18 DIAGNOSIS — R0789 Other chest pain: Secondary | ICD-10-CM | POA: Insufficient documentation

## 2019-07-18 DIAGNOSIS — I119 Hypertensive heart disease without heart failure: Secondary | ICD-10-CM | POA: Insufficient documentation

## 2019-07-18 DIAGNOSIS — Z87891 Personal history of nicotine dependence: Secondary | ICD-10-CM | POA: Insufficient documentation

## 2019-07-18 DIAGNOSIS — Z7982 Long term (current) use of aspirin: Secondary | ICD-10-CM | POA: Insufficient documentation

## 2019-07-18 LAB — BASIC METABOLIC PANEL
Anion gap: 7 (ref 5–15)
BUN: 12 mg/dL (ref 6–20)
CO2: 26 mmol/L (ref 22–32)
Calcium: 9.1 mg/dL (ref 8.9–10.3)
Chloride: 106 mmol/L (ref 98–111)
Creatinine, Ser: 1 mg/dL (ref 0.61–1.24)
GFR calc Af Amer: >60 mL/min (ref >60)
GFR calc non Af Amer: >60 mL/min (ref >60)
Glucose, Bld: 92 mg/dL (ref 70–99)
Potassium: 3.8 mmol/L (ref 3.5–5.1)
Sodium: 139 mmol/L (ref 135–145)

## 2019-07-18 LAB — CBC
HCT: 45.9 % (ref 39.0–52.0)
Hemoglobin: 15.3 g/dL (ref 13.0–17.0)
MCH: 26.3 pg (ref 26.0–34.0)
MCHC: 33.3 g/dL (ref 30.0–36.0)
MCV: 79 fL — ABNORMAL LOW (ref 80.0–100.0)
Platelets: 213 10*3/uL (ref 150–400)
RBC: 5.81 MIL/uL (ref 4.22–5.81)
RDW: 13.6 % (ref 11.5–15.5)
WBC: 6.9 10*3/uL (ref 4.0–10.5)
nRBC: 0 % (ref 0.0–0.2)

## 2019-07-18 LAB — TROPONIN I (HIGH SENSITIVITY): Troponin I (High Sensitivity): 3 ng/L (ref <18)

## 2019-07-18 MED ORDER — IOHEXOL 300 MG/ML  SOLN
75.0000 mL | Freq: Once | INTRAMUSCULAR | Status: AC | PRN
  Administered 2019-07-18: 14:00:00 100 mL via INTRAVENOUS

## 2019-07-18 MED ORDER — ONDANSETRON HCL 4 MG/2ML IJ SOLN
4.0000 mg | Freq: Once | INTRAMUSCULAR | Status: AC
  Administered 2019-07-18: 4 mg via INTRAVENOUS
  Filled 2019-07-18: qty 2

## 2019-07-18 MED ORDER — HYDROMORPHONE HCL 1 MG/ML IJ SOLN
0.5000 mg | Freq: Once | INTRAMUSCULAR | Status: AC
  Administered 2019-07-18: 0.5 mg via INTRAVENOUS
  Filled 2019-07-18: qty 1

## 2019-07-18 NOTE — ED Notes (Signed)
E-signature not working at this time. Pt verbalized understanding of D/C instructions, prescriptions and follow up care with no further questions at this time. Pt in NAD and ambulatory at time of D/C.  

## 2019-07-18 NOTE — ED Provider Notes (Signed)
[facility] Emergency Department Provider Note  ____________________________________________   First MD Initiated Contact with Patient 07/18/19 1250     (approximate)  I have reviewed the triage vital signs and the nursing notes.   HISTORY  Chief Complaint Fall and Chest Pain    HPI Thomas Bautista is a 54 y.o. male who presents for fall.  Patient fell 12 feet landing on his left side. Patient stated that some wooden planks fell down and hit him and he fell off the ladder. Denying chest pain or shortness of breath. Generalized left-sided. Severe pain, constant, worse with moving, better at rest.  Patient stated that he did not hit his head.  Did not lose consciousness.  He states that he thinks he might be on a blood thinner for his heart but is not sure the exact name.  Patient is adamant that he did not hit his head and does not have a headache.  He denies any cervical spine tenderness or neuro deficits.          Past Medical History:  Diagnosis Date  . Congestive heart failure (CHF) (HCC)   . COPD (chronic obstructive pulmonary disease) (HCC)   . Coronary artery disease   . GERD (gastroesophageal reflux disease)   . Hypercholesterolemia   . Hypertension     There are no problems to display for this patient.   Past Surgical History:  Procedure Laterality Date  . CORONARY ANGIOPLASTY WITH STENT PLACEMENT     7 cardiac stents    Prior to Admission medications   Medication Sig Start Date End Date Taking? Authorizing Provider  albuterol (PROVENTIL HFA;VENTOLIN HFA) 108 (90 Base) MCG/ACT inhaler Inhale 2 puffs into the lungs every 4 (four) hours as needed for wheezing or shortness of breath. 03/04/18   Iloabachie, Chioma E, NP  amLODipine (NORVASC) 5 MG tablet Take 5 mg by mouth daily.    [provider]  aspirin 81 MG chewable tablet CHEW 1 TABLET BY MOUTH EVERY DAY 07/05/18   Iloabachie, Chioma E, NP  atorvastatin (LIPITOR) 80 MG tablet  TAKE ONE TABLET BY MOUTH EVERY DAY AT 6:00pm 07/05/18   Iloabachie, Chioma E, NP  benazepril (LOTENSIN) 20 MG tablet TAKE TWO TABLETS (40MG ) BY MOUTH EVERY DAY 05/17/18   Iloabachie, Chioma E, NP  bumetanide (BUMEX) 1 MG tablet Take 1 tablet (1 mg total) by mouth daily. 03/04/18   Iloabachie, Chioma E, NP  carvedilol (COREG) 25 MG tablet TAKE ONE TABLET BY MOUTH 2 TIMES A DAY WITH A MEAL 08/09/18   Iloabachie, Chioma E, NP  cloNIDine (CATAPRES) 0.2 MG tablet TAKE ONE TABLET BY MOUTH 2 TIMES A DAY Patient taking differently: Take 0.1 mg by mouth daily.  04/21/18   Iloabachie, Chioma E, NP  clopidogrel (PLAVIX) 75 MG tablet Take 1 tablet (75 mg total) by mouth daily. 03/04/18   Iloabachie, Chioma E, NP  dexlansoprazole (DEXILANT) 60 MG capsule Take 60 mg by mouth daily.    [provider]  isosorbide mononitrate (IMDUR) 60 MG 24 hr tablet Take 180 mg by mouth daily.     [provider]  nitroGLYCERIN (NITROSTAT) 0.4 MG SL tablet 0.4 mg. Frequency:PHARMDIR   Dosage:0.4   MG  Instructions:  Note:Take 1 tab under the tongue for chest pain, you can take another after 5 minutes if not relieved, up to 3 tabs in 15 mins. If not relieved, call your doctor or go to the ER. Dose: 0.4 MG 03/10/18   Iloabachie,  Chioma E, NP  sucralfate (CARAFATE) 1 g tablet Take 1 g by mouth 4 (four) times daily.    [provider]  traZODone (DESYREL) 100 MG tablet Take 100 mg by mouth at bedtime.    [provider]  umeclidinium-vilanterol (ANORO ELLIPTA) 62.5-25 MCG/INH AEPB Inhale 1 puff into the lungs daily. 03/04/18   Iloabachie, Chioma E, NP    Allergies Furosemide and Spironolactone  Family History  Problem Relation Age of Onset  . Cancer Mother   . Coronary artery disease Mother   . Coronary artery disease Father     Social History Social History   Tobacco Use  . Smoking status: Former Smoker    Packs/day: 0.50  . Smokeless tobacco: Never Used  . Tobacco comment: Cutting  back  Substance Use Topics  . Alcohol use: Never  . Drug use: Yes    Types: Marijuana    Comment: 1 month ago      Review of Systems Constitutional: No fever/chills positive fall Eyes: No visual changes. ENT: No sore throat. Cardiovascular: Denies chest pain.,  Chest wall pain Respiratory: Denies shortness of breath. Gastrointestinal: Upper abdominal pain no nausea, no vomiting.  No diarrhea.  No constipation. Genitourinary: Negative for dysuria. Musculoskeletal: Negative for back pain. Skin: Negative for rash. Neurological: Negative for headaches, focal weakness or numbness. All other ROS negative ____________________________________________   PHYSICAL EXAM:  VITAL SIGNS: ED Triage Vitals  Enc Vitals Group     BP 07/18/19 1229 (!) 128/102     Pulse Rate 07/18/19 1229 79     Resp 07/18/19 1229 18     Temp 07/18/19 1229 98.1 F (36.7 C)     Temp Source 07/18/19 1229 Oral     SpO2 07/18/19 1229 98 %     Weight 07/18/19 1230 175 lb (79.4 kg)     Height 07/18/19 1230 5\' 11"  (1.803 m)     Head Circumference --      Peak Flow --      Pain Score 07/18/19 1230 10     Pain Loc --      Pain Edu? --      Excl. in GC? --     Constitutional: Alert and oriented. GCS 15  Eyes: Conjunctivae are normal. EOMI. Head: Atraumatic. Nose: No congestion/rhinnorhea. Mouth/Throat: Mucous membranes are moist.   Neck: No stridor. Trachea Midline. FROM Cardiovascular: Normal rate, regular rhythm. Grossly normal heart sounds.  Good peripheral circulation.  Tenderness on the left chest wall with significant bruising and abrasion Respiratory: Normal respiratory effort.  No retractions. Lungs CTAB. Gastrointestinal: tenderness in the left upper quadrant no distention. No abdominal bruits.  Musculoskeletal:   RUE: No point tenderness, deformity or other signs of injury. Radial pulse intact. Neuro intact. Full ROM in joint. LUE: No point tenderness, deformity or other signs of injury. Radial  pulse intact. Neuro intact. Full ROM in joints RLE: No point tenderness, deformity or other signs of injury. DP pulse intact. Neuro intact. Full ROM in joints. LLE: No point tenderness, deformity or other signs of injury. DP pulse intact. Neuro intact. Full ROM in joints. Neurologic:  Normal speech and language. No gross focal neurologic deficits are appreciated.  Skin:  Skin is warm, dry and intact. No rash noted. Psychiatric: Mood and affect are normal. Speech and behavior are normal. GU: Deferred  No CTL spine tenderness ____________________________________________   LABS (all labs ordered are listed, but only abnormal results are displayed)  Labs Reviewed  CBC - Abnormal;  Notable for the following components:      Result Value   MCV 79.0 (*)    All other components within normal limits  BASIC METABOLIC PANEL  TROPONIN I (HIGH SENSITIVITY)  TROPONIN I (HIGH SENSITIVITY)   ____________________________________________   ED ECG REPORT I, Concha Se, the attending physician, personally viewed and interpreted this ECG.  EKG is normal sinus rate of 72, no ST elevation, no T wave inversion, normal intervals ____________________________________________  RADIOLOGY   Official radiology report(s): CT Chest W Contrast  Result Date: 07/18/2019 CLINICAL DATA:  Left-sided chest wall pain after falling 12 feet off a ladder. EXAM: CT CHEST, ABDOMEN, AND PELVIS WITH CONTRAST TECHNIQUE: Multidetector CT imaging of the chest, abdomen and pelvis was performed following the standard protocol during bolus administration of intravenous contrast. CONTRAST:  OMNIPAQUE IOHEXOL 300 MG/ML  SOLN COMPARISON:  Chest x-ray dated February 01, 2018. FINDINGS: CT CHEST FINDINGS Cardiovascular: No significant vascular findings. Normal heart size. No pericardial effusion. No thoracic aortic aneurysm or dissection. Coronary, aortic arch, and branch vessel atherosclerotic vascular disease. No central  pulmonary embolism. Mediastinum/Nodes: No enlarged mediastinal, hilar, or axillary lymph nodes. 1.8 cm heterogeneous nodule in the right thyroid lobe. The trachea and esophagus demonstrate no significant findings. Lungs/Pleura: No focal consolidation, pleural effusion, or pneumothorax. The lungs are hyperinflated. Mild centrilobular and paraseptal emphysema. Dependent subsegmental atelectasis in the posterior right greater than left lower lobes. 6 x 6 mm nodule in the lingula (series 5, image 123). 3 mm nodule in the posterior left upper lobe (series 5, image 69). Musculoskeletal: No acute or significant osseous findings. CT ABDOMEN PELVIS FINDINGS Hepatobiliary: No hepatic injury or perihepatic hematoma. 2.7 cm simple cyst in the left hepatic lobe. Subcentimeter low-density lesion in the inferior tip of the liver is too small to characterize. Gallbladder is unremarkable. No biliary dilatation. Pancreas: Unremarkable. No pancreatic ductal dilatation or surrounding inflammatory changes. Spleen: No splenic injury or perisplenic hematoma. Adrenals/Urinary Tract: No adrenal hemorrhage or renal injury identified. Bilateral renal simple cysts. Bladder is unremarkable. Stomach/Bowel: Stomach is within normal limits. Appendix appears normal. No evidence of bowel wall thickening, distention, or inflammatory changes. Vascular/Lymphatic: Aortic atherosclerosis. No enlarged abdominal or pelvic lymph nodes. Reproductive: Borderline enlarged prostate gland. Other: No abdominal wall hernia or abnormality. No abdominopelvic ascites. No pneumoperitoneum. Musculoskeletal: No acute or significant osseous findings. IMPRESSION: 1. No acute traumatic injury within the chest, abdomen, or pelvis. 2. 6 mm pulmonary nodule in the lingula. Non-contrast chest CT at 6-12 months is recommended. If the nodule is stable at time of repeat CT, then future CT at 18-24 months (from today's scan) is considered optional for low-risk patients, but is  recommended for high-risk patients. This recommendation follows the consensus statement: Guidelines for Management of Incidental Pulmonary Nodules Detected on CT Images: From the Fleischner Society 2017; Radiology 2017; 284:228-243. 3. 1.8 cm heterogeneous nodule in the right thyroid lobe. Recommend thyroid US (ref: J Am Coll Radiol. 2015 Feb;12(2): 143-50). 4. Aortic Atherosclerosis (ICD10-I70.0) and Emphysema (ICD10-J43.9). Electronically Signed   By: Obie Dredge M.D.   On: 07/18/2019 14:23   CT ABDOMEN PELVIS W CONTRAST  Result Date: 07/18/2019 CLINICAL DATA:  Left-sided chest wall pain after falling 12 feet off a ladder. EXAM: CT CHEST, ABDOMEN, AND PELVIS WITH CONTRAST TECHNIQUE: Multidetector CT imaging of the chest, abdomen and pelvis was performed following the standard protocol during bolus administration of intravenous contrast. CONTRAST:  OMNIPAQUE IOHEXOL 300 MG/ML  SOLN COMPARISON:  Chest x-ray dated  February 01, 2018. FINDINGS: CT CHEST FINDINGS Cardiovascular: No significant vascular findings. Normal heart size. No pericardial effusion. No thoracic aortic aneurysm or dissection. Coronary, aortic arch, and branch vessel atherosclerotic vascular disease. No central pulmonary embolism. Mediastinum/Nodes: No enlarged mediastinal, hilar, or axillary lymph nodes. 1.8 cm heterogeneous nodule in the right thyroid lobe. The trachea and esophagus demonstrate no significant findings. Lungs/Pleura: No focal consolidation, pleural effusion, or pneumothorax. The lungs are hyperinflated. Mild centrilobular and paraseptal emphysema. Dependent subsegmental atelectasis in the posterior right greater than left lower lobes. 6 x 6 mm nodule in the lingula (series 5, image 123). 3 mm nodule in the posterior left upper lobe (series 5, image 69). Musculoskeletal: No acute or significant osseous findings. CT ABDOMEN PELVIS FINDINGS Hepatobiliary: No hepatic injury or perihepatic hematoma. 2.7 cm simple cyst in  the left hepatic lobe. Subcentimeter low-density lesion in the inferior tip of the liver is too small to characterize. Gallbladder is unremarkable. No biliary dilatation. Pancreas: Unremarkable. No pancreatic ductal dilatation or surrounding inflammatory changes. Spleen: No splenic injury or perisplenic hematoma. Adrenals/Urinary Tract: No adrenal hemorrhage or renal injury identified. Bilateral renal simple cysts. Bladder is unremarkable. Stomach/Bowel: Stomach is within normal limits. Appendix appears normal. No evidence of bowel wall thickening, distention, or inflammatory changes. Vascular/Lymphatic: Aortic atherosclerosis. No enlarged abdominal or pelvic lymph nodes. Reproductive: Borderline enlarged prostate gland. Other: No abdominal wall hernia or abnormality. No abdominopelvic ascites. No pneumoperitoneum. Musculoskeletal: No acute or significant osseous findings. IMPRESSION: 1. No acute traumatic injury within the chest, abdomen, or pelvis. 2. 6 mm pulmonary nodule in the lingula. Non-contrast chest CT at 6-12 months is recommended. If the nodule is stable at time of repeat CT, then future CT at 18-24 months (from today's scan) is considered optional for low-risk patients, but is recommended for high-risk patients. This recommendation follows the consensus statement: Guidelines for Management of Incidental Pulmonary Nodules Detected on CT Images: From the Fleischner Society 2017; Radiology 2017; 284:228-243. 3. 1.8 cm heterogeneous nodule in the right thyroid lobe. Recommend thyroid US (ref: J Am Coll Radiol. 2015 Feb;12(2): 143-50). 4. Aortic Atherosclerosis (ICD10-I70.0) and Emphysema (ICD10-J43.9). Electronically Signed   By: Obie Dredge M.D.   On: 07/18/2019 14:23    ____________________________________________   PROCEDURES  Procedure(s) performed (including Critical Care):  Procedures   ____________________________________________   INITIAL IMPRESSION / ASSESSMENT AND PLAN / ED  COURSE    Thomas Bautista was evaluated in Emergency Department on 07/18/2019 for the symptoms described in the history of present illness. He was evaluated in the context of the global COVID-19 pandemic, which necessitated consideration that the patient might be at risk for infection with the SARS-CoV-2 virus that causes COVID-19. Institutional protocols and algorithms that pertain to the evaluation of patients at risk for COVID-19 are in a state of rapid change based on information released by regulatory bodies including the CDC and federal and state organizations. These policies and algorithms were followed during the patient's care in the ED.    Patient is obvious injury to the left chest wall and left upper abdomen with pain with palpation.  Discussed with patient CT imaging would be the best to rule out rib fractures, pneumothorax, abdominal injury such as splenic rupture kidney rupture.  Patient okay with proceeding with this.  We discussed doing CT head just to make sure there is no evidence of intracranial hemorrhage given patient states he might be on a blood thinner and he did have a fall from a high height.  Patient  is adamant that is not having headache or that he is hit his head.  Patient does not want to have CT imaging of his head and he understands the risk of this which I think is reassuring if patient not hit his head.  Patient we also discussed CT imaging of his neck given possible distracting injury but again patient has no C-spine tenderness or neurological injury he states that he would like to try to hold off on that if possible.  Patient is okay with at least proceeding with a CT of his chest and abdomen.  CT imaging was negative.  Discussed the incidental findings on CT scans and patient provided a report.  Patient understands to take Tylenol ibuprofen.  He declines following Workmen's Comp.     ____________________________________________   FINAL CLINICAL IMPRESSION(S) / ED  DIAGNOSES   Final diagnoses:  Fall, initial encounter  Chest wall pain      MEDICATIONS GIVEN DURING THIS VISIT:  Medications  HYDROmorphone (DILAUDID) injection 0.5 mg (0.5 mg Intravenous Given 07/18/19 1317)  ondansetron (ZOFRAN) injection 4 mg (4 mg Intravenous Given 07/18/19 1317)  iohexol (OMNIPAQUE) 300 MG/ML solution 75 mL (100 mLs Intravenous Contrast Given 07/18/19 1357)     ED Discharge Orders    None       Note:  This document was prepared using Dragon voice recognition software and may include unintentional dictation errors.   Vanessa [place], MD 07/18/19 1530

## 2019-07-18 NOTE — ED Triage Notes (Signed)
FIRST NURSE NOTE- fell 12 feet off ladder landing on left side. Board fell hitting back as well. Denies neck pain at this time.

## 2019-07-18 NOTE — ED Triage Notes (Signed)
Pt arrives to ER from falling 12 feet off a ladder. States he was working, unsure if dizziness/tripped, states "probably both." fell on back and knees. C/o L sided rib cage pain. A&O. Speaking in complete sentences. Denies LOC. Denies hitting head.

## 2019-07-18 NOTE — ED Notes (Signed)
Pt ambulatory to bedside commode with steady gait. Pt in NAD.

## 2019-07-18 NOTE — Discharge Instructions (Signed)
Your CT scans are as below.  There are some incidental findings that you will need follow-up with outpatient.  Take Tylenol 1 g every 8 hours and you can take ibuprofen 600 every 6 hours with food.  Return to the ER for any other concerns   IMPRESSION:  1. No acute traumatic injury within the chest, abdomen, or pelvis.  2. 6 mm pulmonary nodule in the lingula. Non-contrast chest CT at  6-12 months is recommended. If the nodule is stable at time of  repeat CT, then future CT at 18-24 months (from today's scan) is  considered optional for low-risk patients, but is recommended for  high-risk patients. This recommendation follows the consensus  statement: Guidelines for Management of Incidental Pulmonary Nodules  Detected on CT Images: From the Fleischner Society 2017; Radiology  2017; 284:228-243.  3. 1.8 cm heterogeneous nodule in the right thyroid lobe. Recommend  thyroid US (ref: J Am Coll Radiol. 2015 Feb;12(2): 143-50).  4. Aortic Atherosclerosis (ICD10-I70.0) and Emphysema (ICD10-J43.9).

## 2019-08-09 ENCOUNTER — Other Ambulatory Visit: Payer: Self-pay | Admitting: Physician Assistant

## 2019-08-10 ENCOUNTER — Telehealth: Payer: Self-pay | Admitting: Pharmacy Technician

## 2019-08-10 NOTE — Telephone Encounter (Signed)
Received updated proof of income.  Patient eligible to receive medication assistance at [facility] until time for re-certification in 5361, and as long as eligibility requirements continue to be met.  [facility]

## 2019-08-11 ENCOUNTER — Other Ambulatory Visit: Payer: Self-pay | Admitting: Physician Assistant

## 2019-09-12 ENCOUNTER — Other Ambulatory Visit: Payer: Self-pay | Admitting: Pulmonary Disease

## 2019-09-13 ENCOUNTER — Other Ambulatory Visit: Payer: Self-pay | Admitting: Physician Assistant

## 2019-09-30 ENCOUNTER — Other Ambulatory Visit: Payer: Self-pay | Admitting: Physician Assistant

## 2019-10-24 ENCOUNTER — Other Ambulatory Visit: Payer: 59 | Admitting: Pharmacist

## 2019-11-16 ENCOUNTER — Telehealth: Payer: Self-pay | Admitting: Pharmacist

## 2019-11-16 NOTE — Telephone Encounter (Signed)
11/16/2019 10:45:14 AM - Ventolin refill online with [facility]  -- Rhetta Mura - Wednesday, November 16, 2019 10:44 AM --Placed refill online with [facility] for Ventolin to ship 11/30/2019, Order# [id number].

## 2019-12-13 ENCOUNTER — Other Ambulatory Visit: Payer: Self-pay | Admitting: Physician Assistant

## 2019-12-27 ENCOUNTER — Other Ambulatory Visit: Payer: Self-pay | Admitting: Physician Assistant

## 2020-02-06 ENCOUNTER — Other Ambulatory Visit: Payer: Self-pay | Admitting: Physician Assistant

## 2020-03-29 ENCOUNTER — Other Ambulatory Visit: Payer: Self-pay | Admitting: Physician Assistant

## 2020-05-22 ENCOUNTER — Telehealth: Payer: Self-pay | Admitting: Pharmacist

## 2020-05-22 NOTE — Telephone Encounter (Signed)
05/22/2020 11:53:37 AM - ProAir faxed to [facility]  -- Rhetta Mura - Tuesday, May 22, 2020 11:53 AM --Donetta Potts application for ProAir HFA (replaces Ventolin).

## 2020-05-31 ENCOUNTER — Other Ambulatory Visit: Payer: Self-pay | Admitting: Physician Assistant

## 2020-06-06 ENCOUNTER — Telehealth: Payer: Self-pay | Admitting: Pharmacist

## 2020-06-06 NOTE — Telephone Encounter (Signed)
Provided 2022 proof of income. Approved to receive medication assistance at [facility] until time for re-certification in 5364, and as long as eligibility criteria continues to be met.   [place]

## 2020-06-11 ENCOUNTER — Other Ambulatory Visit: Payer: Self-pay | Admitting: Gerontology

## 2020-06-11 DIAGNOSIS — J449 Chronic obstructive pulmonary disease, unspecified: Secondary | ICD-10-CM

## 2020-06-15 ENCOUNTER — Other Ambulatory Visit: Payer: Self-pay | Admitting: Physician Assistant

## 2020-06-19 ENCOUNTER — Other Ambulatory Visit: Payer: Self-pay | Admitting: Physician Assistant

## 2020-06-23 MED FILL — Trazodone HCl Tab 100 MG: ORAL | 30 days supply | Qty: 45 | Fill #0 | Status: AC

## 2020-06-25 ENCOUNTER — Other Ambulatory Visit: Payer: Self-pay

## 2020-06-26 ENCOUNTER — Other Ambulatory Visit: Payer: Self-pay

## 2020-06-29 ENCOUNTER — Other Ambulatory Visit: Payer: Self-pay

## 2020-06-29 MED FILL — Sucralfate Tab 1 GM: ORAL | 90 days supply | Qty: 180 | Fill #0 | Status: AC

## 2020-07-02 ENCOUNTER — Other Ambulatory Visit: Payer: Self-pay

## 2020-07-03 ENCOUNTER — Other Ambulatory Visit: Payer: Self-pay

## 2020-07-03 MED FILL — Dexlansoprazole Cap Delayed Release 60 MG: ORAL | 90 days supply | Qty: 90 | Fill #0 | Status: AC

## 2020-07-06 ENCOUNTER — Other Ambulatory Visit: Payer: Self-pay

## 2020-07-06 MED FILL — Umeclidinium-Vilanterol Aero Powd BA 62.5-25 MCG/ACT: RESPIRATORY_TRACT | 90 days supply | Qty: 180 | Fill #0 | Status: AC

## 2020-07-09 ENCOUNTER — Other Ambulatory Visit: Payer: Self-pay

## 2020-07-09 MED ORDER — DEXLANSOPRAZOLE 60 MG PO CPDR
1.0000 | DELAYED_RELEASE_CAPSULE | Freq: Every day | ORAL | 3 refills | Status: DC
  Filled 2020-10-01: qty 90, 90d supply, fill #0
  Filled 2020-12-13 – 2020-12-27 (×2): qty 90, 90d supply, fill #1
  Filled 2021-03-14 – 2021-03-22 (×2): qty 90, 90d supply, fill #2

## 2020-07-10 ENCOUNTER — Other Ambulatory Visit: Payer: Self-pay

## 2020-07-10 MED FILL — Amlodipine Besylate Tab 5 MG (Base Equivalent): ORAL | 90 days supply | Qty: 90 | Fill #0 | Status: AC

## 2020-07-10 MED FILL — Carvedilol Tab 25 MG: ORAL | 90 days supply | Qty: 180 | Fill #0 | Status: AC

## 2020-07-11 ENCOUNTER — Other Ambulatory Visit: Payer: Self-pay

## 2020-07-24 ENCOUNTER — Other Ambulatory Visit: Payer: Self-pay

## 2020-08-02 ENCOUNTER — Other Ambulatory Visit: Payer: Self-pay

## 2020-08-02 MED FILL — Benazepril HCl Tab 40 MG: ORAL | 30 days supply | Qty: 30 | Fill #0 | Status: AC

## 2020-08-02 MED FILL — Trazodone HCl Tab 100 MG: ORAL | 30 days supply | Qty: 45 | Fill #1 | Status: AC

## 2020-08-06 ENCOUNTER — Other Ambulatory Visit: Payer: Self-pay

## 2020-08-06 MED ORDER — PEG-3350/ELECTROLYTES 236 G PO SOLR
ORAL | 0 refills | Status: AC
  Filled 2020-08-06: qty 4000, 1d supply, fill #0

## 2020-08-06 MED ORDER — HYOSCYAMINE SULFATE 0.125 MG PO TBDP
ORAL_TABLET | ORAL | 5 refills | Status: DC
  Filled 2020-08-06: qty 120, 30d supply, fill #0
  Filled 2021-01-08: qty 120, 30d supply, fill #1

## 2020-08-07 ENCOUNTER — Other Ambulatory Visit: Payer: Self-pay

## 2020-08-08 ENCOUNTER — Other Ambulatory Visit: Payer: Self-pay

## 2020-08-08 MED ORDER — ALBUTEROL SULFATE HFA 108 (90 BASE) MCG/ACT IN AERS
INHALATION_SPRAY | RESPIRATORY_TRACT | 3 refills | Status: AC
  Filled 2020-08-08: qty 25.5, 50d supply, fill #0
  Filled 2020-10-03 – 2020-10-11 (×2): qty 25.5, 50d supply, fill #1
  Filled 2020-12-13: qty 25.5, 50d supply, fill #2
  Filled 2021-02-28: qty 25.5, 50d supply, fill #3

## 2020-08-13 ENCOUNTER — Other Ambulatory Visit: Payer: Self-pay

## 2020-08-30 ENCOUNTER — Other Ambulatory Visit: Payer: Self-pay

## 2020-08-30 MED ORDER — BENAZEPRIL HCL 40 MG PO TABS
ORAL_TABLET | ORAL | 1 refills | Status: DC
  Filled 2020-08-30: qty 90, 90d supply, fill #0
  Filled 2020-10-03 – 2020-11-26 (×3): qty 90, 90d supply, fill #1

## 2020-08-31 ENCOUNTER — Other Ambulatory Visit: Payer: Self-pay

## 2020-08-31 MED ORDER — MECLIZINE HCL 25 MG PO TABS
ORAL_TABLET | ORAL | 0 refills | Status: DC
  Filled 2020-08-31: qty 30, 10d supply, fill #0

## 2020-09-03 ENCOUNTER — Other Ambulatory Visit: Payer: Self-pay

## 2020-09-11 MED FILL — Isosorbide Mononitrate Tab ER 24HR 60 MG: ORAL | 90 days supply | Qty: 180 | Fill #0 | Status: AC

## 2020-09-11 MED FILL — Trazodone HCl Tab 100 MG: ORAL | 30 days supply | Qty: 45 | Fill #2 | Status: AC

## 2020-09-11 MED FILL — Atorvastatin Calcium Tab 80 MG (Base Equivalent): ORAL | 90 days supply | Qty: 90 | Fill #0 | Status: AC

## 2020-09-12 ENCOUNTER — Other Ambulatory Visit: Payer: Self-pay

## 2020-09-12 MED ORDER — ATORVASTATIN CALCIUM 80 MG PO TABS
ORAL_TABLET | ORAL | 3 refills | Status: AC
  Filled 2020-12-13: qty 90, 90d supply, fill #0
  Filled 2021-03-14: qty 90, 90d supply, fill #1
  Filled 2021-06-12: qty 90, 90d supply, fill #2

## 2020-09-19 ENCOUNTER — Other Ambulatory Visit: Payer: Self-pay

## 2020-09-20 ENCOUNTER — Other Ambulatory Visit: Payer: Self-pay

## 2020-09-20 MED ORDER — ANORO ELLIPTA 62.5-25 MCG/INH IN AEPB
INHALATION_SPRAY | RESPIRATORY_TRACT | 5 refills | Status: DC
  Filled 2020-10-11: qty 180, 90d supply, fill #0
  Filled 2020-12-03: qty 180, 90d supply, fill #1

## 2020-09-27 ENCOUNTER — Other Ambulatory Visit: Payer: Self-pay

## 2020-09-27 MED FILL — Aspirin Chew Tab 81 MG: ORAL | 30 days supply | Qty: 30 | Fill #0 | Status: CN

## 2020-09-28 ENCOUNTER — Other Ambulatory Visit: Payer: Self-pay

## 2020-10-01 ENCOUNTER — Other Ambulatory Visit: Payer: Self-pay

## 2020-10-01 MED ORDER — ASPIRIN 81 MG PO CHEW
CHEWABLE_TABLET | ORAL | 3 refills | Status: DC
  Filled 2020-10-01: qty 90, 90d supply, fill #0
  Filled 2020-11-26 – 2021-01-02 (×2): qty 90, 90d supply, fill #1

## 2020-10-01 MED ORDER — CLOPIDOGREL BISULFATE 75 MG PO TABS
ORAL_TABLET | ORAL | 1 refills | Status: DC
  Filled 2020-10-01: qty 90, 90d supply, fill #0
  Filled 2020-12-29: qty 90, 90d supply, fill #1

## 2020-10-02 ENCOUNTER — Other Ambulatory Visit: Payer: Self-pay

## 2020-10-03 ENCOUNTER — Other Ambulatory Visit: Payer: Self-pay

## 2020-10-11 ENCOUNTER — Other Ambulatory Visit: Payer: Self-pay

## 2020-10-15 ENCOUNTER — Other Ambulatory Visit: Payer: Self-pay

## 2020-10-16 ENCOUNTER — Other Ambulatory Visit: Payer: Self-pay

## 2020-10-16 MED ORDER — CARVEDILOL 25 MG PO TABS
ORAL_TABLET | ORAL | 3 refills | Status: DC
  Filled 2020-10-16 – 2021-01-22 (×2): qty 180, 90d supply, fill #0
  Filled 2021-04-09: qty 180, 90d supply, fill #1
  Filled 2021-07-02: qty 180, 90d supply, fill #2

## 2020-10-16 MED ORDER — CARVEDILOL 25 MG PO TABS
ORAL_TABLET | Freq: Every day | ORAL | 3 refills | Status: DC
  Filled 2020-10-16: qty 180, 90d supply, fill #0

## 2020-10-16 MED ORDER — AMLODIPINE BESYLATE 5 MG PO TABS
ORAL_TABLET | Freq: Every day | ORAL | 3 refills | Status: DC
  Filled 2020-10-16: qty 90, 90d supply, fill #0

## 2020-10-16 MED ORDER — AMLODIPINE BESYLATE 5 MG PO TABS
ORAL_TABLET | ORAL | 3 refills | Status: DC
  Filled 2020-10-16 – 2021-01-22 (×2): qty 90, 90d supply, fill #0
  Filled 2021-04-09: qty 90, 90d supply, fill #1

## 2020-10-16 MED ORDER — SERTRALINE HCL 50 MG PO TABS
ORAL_TABLET | ORAL | 0 refills | Status: DC
  Filled 2020-10-16: qty 60, 37d supply, fill #0

## 2020-10-19 ENCOUNTER — Other Ambulatory Visit: Payer: Self-pay

## 2020-10-19 MED FILL — Trazodone HCl Tab 100 MG: ORAL | 30 days supply | Qty: 45 | Fill #3 | Status: AC

## 2020-10-31 ENCOUNTER — Other Ambulatory Visit: Payer: Self-pay

## 2020-11-01 ENCOUNTER — Telehealth: Payer: Self-pay | Admitting: Pharmacist

## 2020-11-01 NOTE — Telephone Encounter (Signed)
11/01/2020 9:39:22 AM - Ailene Ards Ellipta refill online with [facility]  -- Rhetta Mura - Thursday, November 01, 2020 9:38 AM --Placed refill online with [facility] for [employer], order# [id number].

## 2020-11-05 ENCOUNTER — Other Ambulatory Visit: Payer: Self-pay

## 2020-11-08 ENCOUNTER — Other Ambulatory Visit: Payer: Self-pay

## 2020-11-08 MED ORDER — BUMETANIDE 1 MG PO TABS
1.0000 mg | ORAL_TABLET | Freq: Every day | ORAL | 1 refills | Status: AC
  Filled 2020-11-08: qty 90, 90d supply, fill #0
  Filled 2020-12-31: qty 90, 90d supply, fill #1

## 2020-11-09 ENCOUNTER — Other Ambulatory Visit: Payer: Self-pay

## 2020-11-20 ENCOUNTER — Other Ambulatory Visit: Payer: Self-pay

## 2020-11-23 ENCOUNTER — Other Ambulatory Visit: Payer: Self-pay

## 2020-11-26 MED FILL — Trazodone HCl Tab 100 MG: ORAL | 30 days supply | Qty: 45 | Fill #4 | Status: AC

## 2020-11-27 ENCOUNTER — Other Ambulatory Visit: Payer: Self-pay

## 2020-11-27 ENCOUNTER — Telehealth: Payer: Self-pay | Admitting: Pharmacist

## 2020-11-27 NOTE — Telephone Encounter (Signed)
11/27/2020 10:32:36 AM - Dexilant refill call to [facility]  -- Rhetta Mura - Tuesday, November 27, 2020 10:31 AM --Renaldo Harrison for refill on Dexilant 60mg , this will be shipped to our office.

## 2020-11-28 ENCOUNTER — Other Ambulatory Visit: Payer: Self-pay

## 2020-11-29 ENCOUNTER — Other Ambulatory Visit: Payer: Self-pay

## 2020-12-03 ENCOUNTER — Other Ambulatory Visit: Payer: Self-pay

## 2020-12-04 ENCOUNTER — Other Ambulatory Visit: Payer: Self-pay

## 2020-12-13 ENCOUNTER — Other Ambulatory Visit: Payer: Self-pay

## 2020-12-13 MED ORDER — SERTRALINE HCL 50 MG PO TABS
ORAL_TABLET | ORAL | 0 refills | Status: DC
  Filled 2020-12-13: qty 120, 30d supply, fill #0
  Filled 2020-12-13: qty 60, 30d supply, fill #0

## 2020-12-13 MED FILL — Isosorbide Mononitrate Tab ER 24HR 60 MG: ORAL | 30 days supply | Qty: 60 | Fill #1 | Status: AC

## 2020-12-14 ENCOUNTER — Other Ambulatory Visit: Payer: Self-pay

## 2020-12-14 MED ORDER — SERTRALINE HCL 50 MG PO TABS
ORAL_TABLET | ORAL | 5 refills | Status: DC
  Filled 2020-12-14: qty 60, 37d supply, fill #0
  Filled 2021-02-05: qty 60, 30d supply, fill #1
  Filled 2021-04-09: qty 60, 30d supply, fill #2

## 2020-12-27 ENCOUNTER — Other Ambulatory Visit: Payer: Self-pay

## 2020-12-31 ENCOUNTER — Other Ambulatory Visit: Payer: Self-pay

## 2021-01-02 ENCOUNTER — Other Ambulatory Visit: Payer: Self-pay

## 2021-01-03 ENCOUNTER — Other Ambulatory Visit: Payer: Self-pay

## 2021-01-03 MED ORDER — NITROGLYCERIN 0.4 MG SL SUBL
SUBLINGUAL_TABLET | SUBLINGUAL | 0 refills | Status: DC
  Filled 2021-01-03: qty 25, 25d supply, fill #0

## 2021-01-08 ENCOUNTER — Other Ambulatory Visit: Payer: Self-pay

## 2021-01-08 MED FILL — Isosorbide Mononitrate Tab ER 24HR 60 MG: ORAL | 30 days supply | Qty: 60 | Fill #2 | Status: AC

## 2021-01-09 ENCOUNTER — Other Ambulatory Visit: Payer: Self-pay

## 2021-01-09 MED ORDER — HYOSCYAMINE SULFATE 0.125 MG PO TBDP
ORAL_TABLET | ORAL | 5 refills | Status: AC
  Filled 2021-01-10: qty 120, fill #0
  Filled 2021-01-16 (×2): qty 100, 25d supply, fill #0
  Filled 2021-01-16: qty 120, fill #0
  Filled 2021-04-29: qty 100, 25d supply, fill #1

## 2021-01-10 ENCOUNTER — Other Ambulatory Visit: Payer: Self-pay

## 2021-01-10 MED ORDER — TRAZODONE HCL 100 MG PO TABS
ORAL_TABLET | ORAL | 3 refills | Status: DC
  Filled 2021-01-10: qty 45, 30d supply, fill #0
  Filled 2021-02-05: qty 45, 30d supply, fill #1
  Filled 2021-03-14: qty 45, 30d supply, fill #2
  Filled 2021-04-09: qty 45, 30d supply, fill #3

## 2021-01-11 ENCOUNTER — Other Ambulatory Visit: Payer: Self-pay

## 2021-01-14 ENCOUNTER — Other Ambulatory Visit: Payer: Self-pay

## 2021-01-14 MED ORDER — AMOXICILLIN-POT CLAVULANATE 875-125 MG PO TABS
ORAL_TABLET | ORAL | 0 refills | Status: AC
  Filled 2021-01-14: qty 14, 7d supply, fill #0

## 2021-01-15 ENCOUNTER — Other Ambulatory Visit: Payer: Self-pay

## 2021-01-16 ENCOUNTER — Other Ambulatory Visit: Payer: Self-pay

## 2021-01-22 ENCOUNTER — Other Ambulatory Visit: Payer: Self-pay

## 2021-01-24 ENCOUNTER — Other Ambulatory Visit: Payer: Self-pay

## 2021-02-05 MED FILL — Isosorbide Mononitrate Tab ER 24HR 60 MG: ORAL | 30 days supply | Qty: 60 | Fill #3 | Status: AC

## 2021-02-06 ENCOUNTER — Other Ambulatory Visit: Payer: Self-pay

## 2021-02-26 ENCOUNTER — Other Ambulatory Visit: Payer: Self-pay

## 2021-02-27 ENCOUNTER — Other Ambulatory Visit: Payer: Self-pay

## 2021-02-27 MED ORDER — BENAZEPRIL HCL 40 MG PO TABS
ORAL_TABLET | ORAL | 1 refills | Status: AC
  Filled 2021-02-27: qty 30, 30d supply, fill #0
  Filled 2021-04-04: qty 30, 30d supply, fill #1
  Filled 2021-04-29: qty 30, 30d supply, fill #2
  Filled 2021-05-26: qty 90, 90d supply, fill #3

## 2021-02-28 ENCOUNTER — Other Ambulatory Visit: Payer: Self-pay

## 2021-03-05 ENCOUNTER — Other Ambulatory Visit: Payer: Self-pay

## 2021-03-05 MED ORDER — DEXLANSOPRAZOLE 60 MG PO CPDR
DELAYED_RELEASE_CAPSULE | ORAL | 3 refills | Status: DC
Start: 2021-03-05 — End: 2021-06-18
  Filled 2021-06-14: qty 90, 90d supply, fill #0
  Filled ????-??-??: fill #0

## 2021-03-10 IMAGING — CT CT ABD-PELV W/ CM
2 of 6 series · 13 of 36 positions shown, 16 images · IV contrast (omnipaque)
Comparison: Chest x-ray dated February 01, 2018.

CLINICAL DATA: Left-sided chest wall pain after falling 12 feet off
a ladder.

EXAM:
CT CHEST, ABDOMEN, AND PELVIS WITH CONTRAST
TECHNIQUE: Multidetector CT imaging of the chest, abdomen and pelvis was
performed following the standard protocol during bolus
administration of intravenous contrast.
CONTRAST:  100mL OMNIPAQUE IOHEXOL 300 MG/ML  SOLN

[Series 3: thins · axial · 0.75mm/px · z∈[-524,+107]mm · 10 of 1003 slices shown, 13 images]
[im 51/1003  mediastinal]
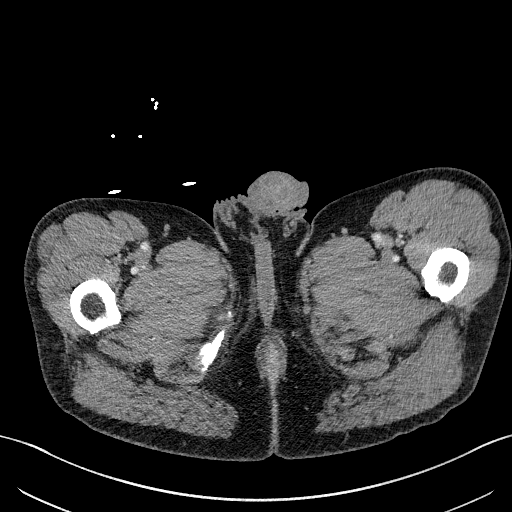
[im 51/1003  lung]
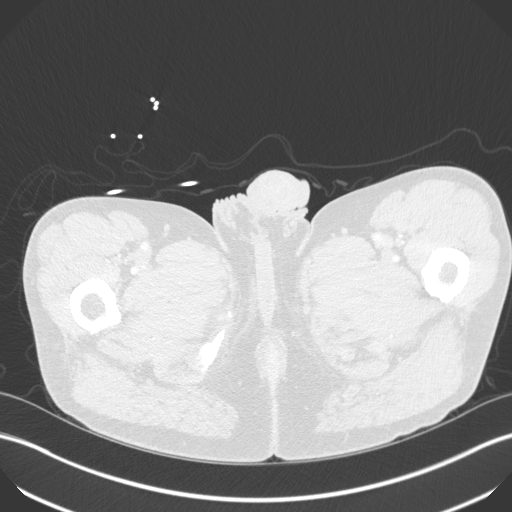
[im 151/1003  lung]
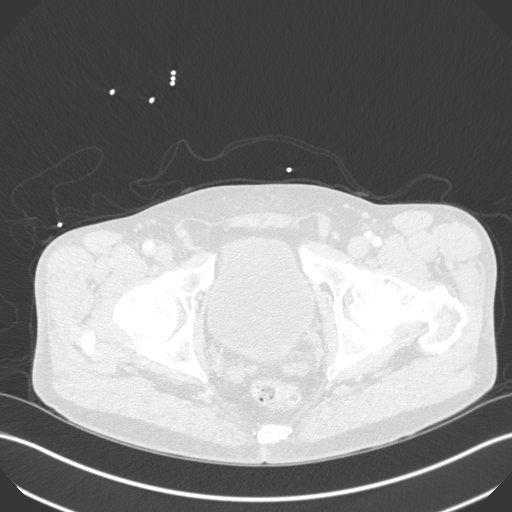
[im 251/1003  lung]
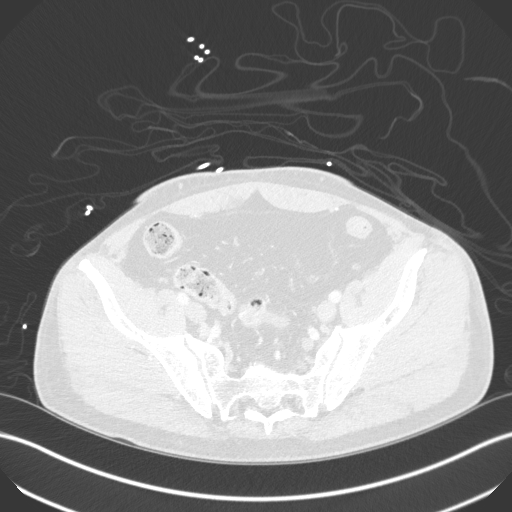
[im 351/1003  lung]
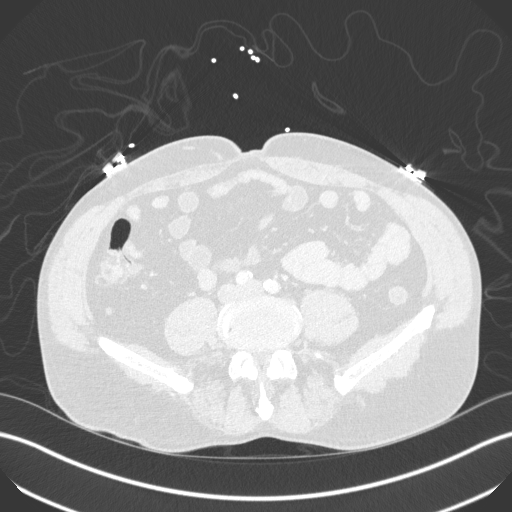
[im 451/1003  mediastinal]
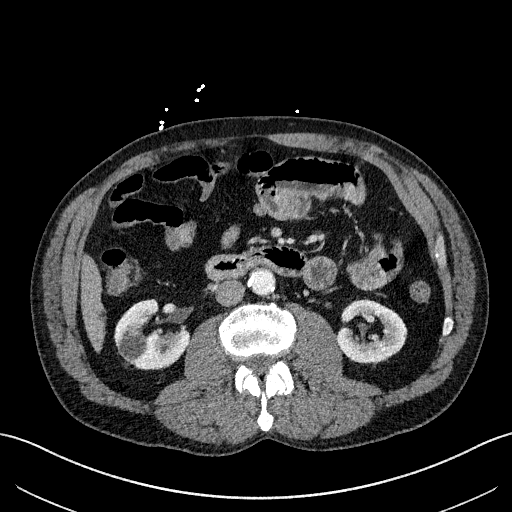
[im 451/1003  lung]
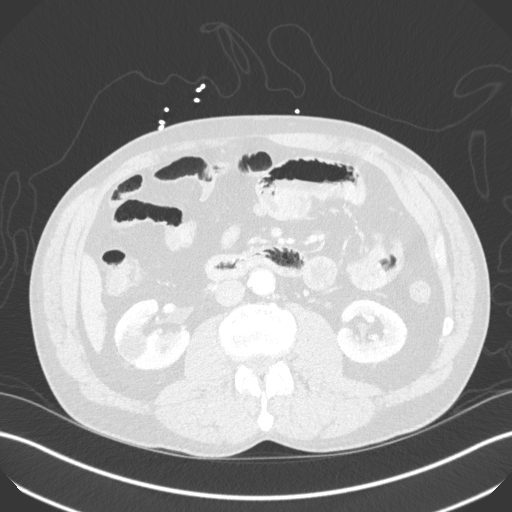
[im 552/1003  lung]
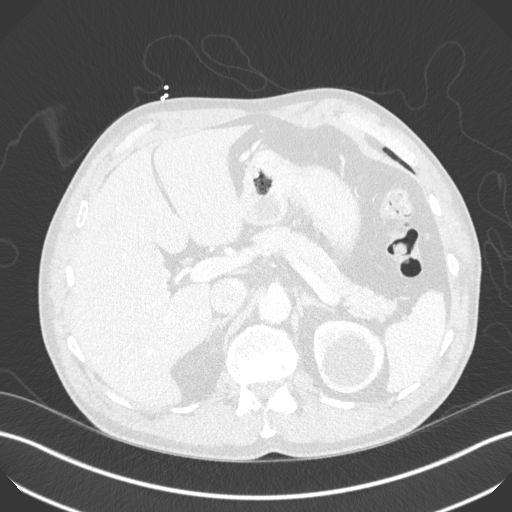
[im 652/1003  lung]
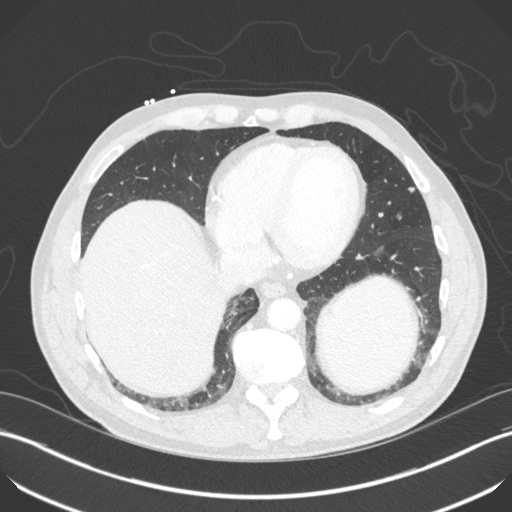
[im 752/1003  lung]
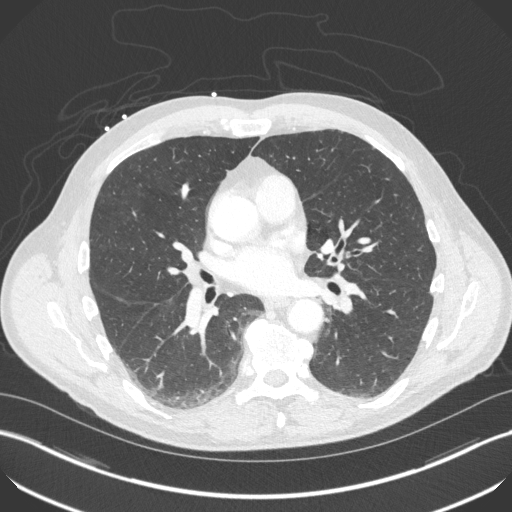
[im 852/1003  mediastinal]
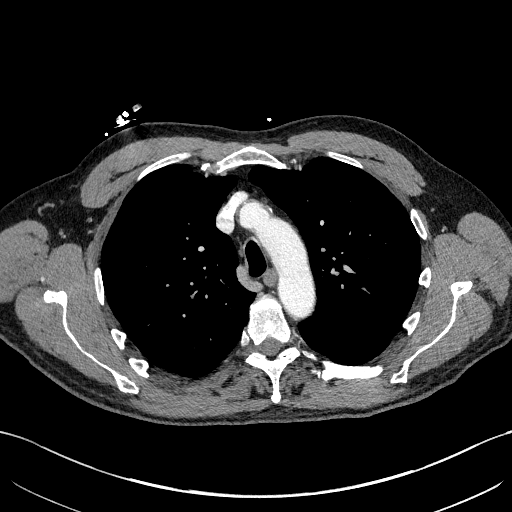
[im 852/1003  lung]
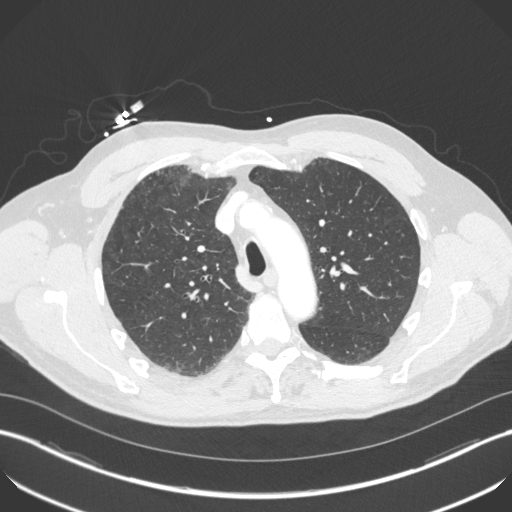
[im 952/1003  lung]
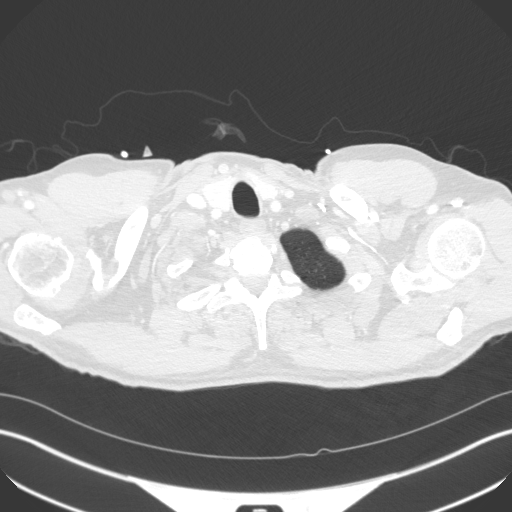

[Series 6: coronals · coronal · 0.84mm/px · 3 of 147 slices shown]
[im 30/147  lung]
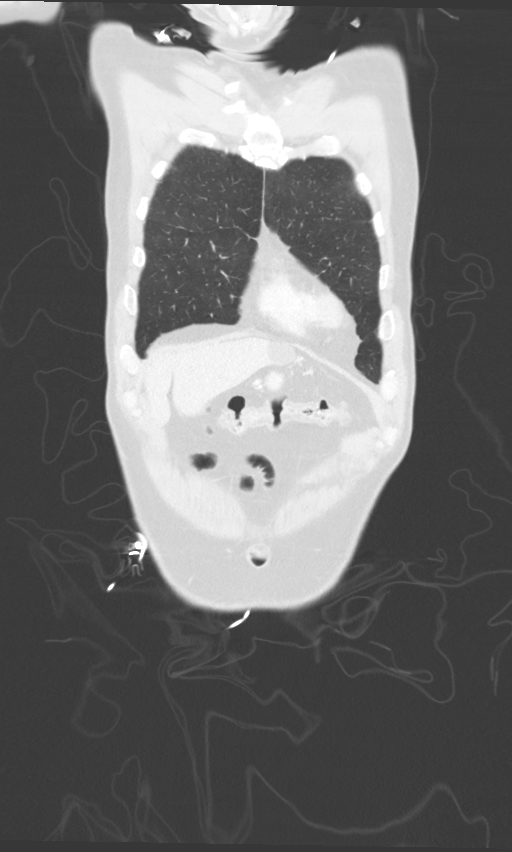
[im 59/147  lung]
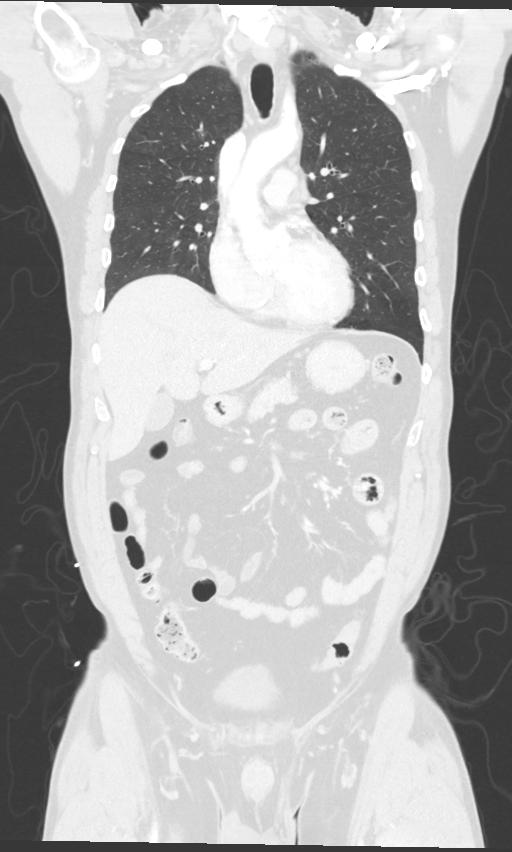
[im 88/147  lung]
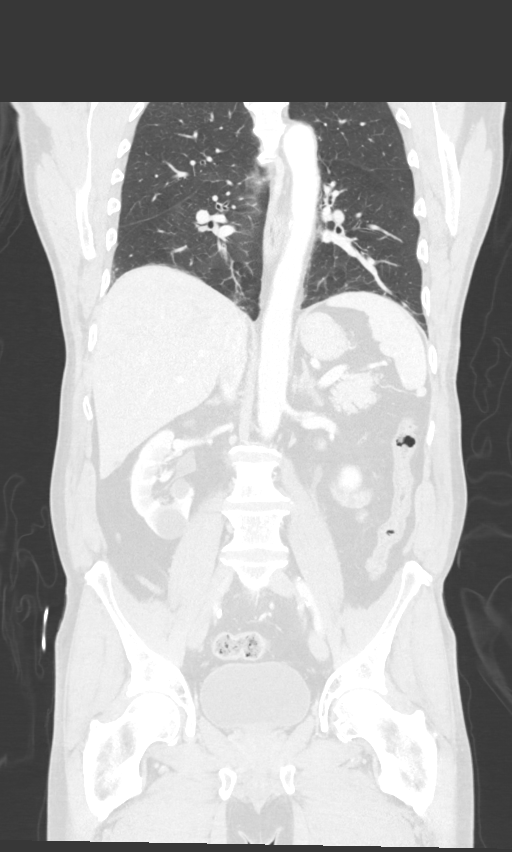

[13 of 36 positions shown; findings below may reference images not displayed]

FINDINGS: CT CHEST FINDINGS

Cardiovascular: No significant vascular findings. Normal heart size.
No pericardial effusion. No thoracic aortic aneurysm or dissection.
Coronary, aortic arch, and branch vessel atherosclerotic vascular
disease. No central pulmonary embolism.

Mediastinum/Nodes: No enlarged mediastinal, hilar, or axillary lymph
nodes. 1.8 cm heterogeneous nodule in the right thyroid lobe. The
trachea and esophagus demonstrate no significant findings.

Lungs/Pleura: No focal consolidation, pleural effusion, or
pneumothorax. The lungs are hyperinflated. Mild centrilobular and
paraseptal emphysema. Dependent subsegmental atelectasis in the
posterior right greater than left lower lobes. 6 x 6 mm nodule in
the lingula (series 5, image 123). 3 mm nodule in the posterior left
upper lobe (series 5, image 69).

Musculoskeletal: No acute or significant osseous findings.

CT ABDOMEN PELVIS FINDINGS

Hepatobiliary: No hepatic injury or perihepatic hematoma. 2.7 cm
simple cyst in the left hepatic lobe. Subcentimeter low-density
lesion in the inferior tip of the liver is too small to
characterize. Gallbladder is unremarkable. No biliary dilatation.

Pancreas: Unremarkable. No pancreatic ductal dilatation or
surrounding inflammatory changes.

Spleen: No splenic injury or perisplenic hematoma.

Adrenals/Urinary Tract: No adrenal hemorrhage or renal injury
identified. Bilateral renal simple cysts. Bladder is unremarkable.

Stomach/Bowel: Stomach is within normal limits. Appendix appears
normal. No evidence of bowel wall thickening, distention, or
inflammatory changes.

Vascular/Lymphatic: Aortic atherosclerosis. No enlarged abdominal or
pelvic lymph nodes.

Reproductive: Borderline enlarged prostate gland.

Other: No abdominal wall hernia or abnormality. No abdominopelvic
ascites. No pneumoperitoneum.

Musculoskeletal: No acute or significant osseous findings.
IMPRESSION: 1. No acute traumatic injury within the chest, abdomen, or pelvis.
2. 6 mm pulmonary nodule in the lingula. Non-contrast chest CT at
6-12 months is recommended. If the nodule is stable at time of
repeat CT, then future CT at 18-24 months (from today's scan) is
considered optional for low-risk patients, but is recommended for
high-risk patients. This recommendation follows the consensus
statement: Guidelines for Management of Incidental Pulmonary Nodules
Detected on CT Images: From the [HOSPITAL] 1870; Radiology
1870; [DATE].
3. 1.8 cm heterogeneous nodule in the right thyroid lobe. Recommend
thyroid US (ref: [HOSPITAL]. [DATE]): 143-50).
4. Aortic Atherosclerosis (QJQVE-9YZ.Z) and Emphysema (QJQVE-QBI.J).

## 2021-03-14 ENCOUNTER — Other Ambulatory Visit: Payer: Self-pay

## 2021-03-14 MED FILL — Isosorbide Mononitrate Tab ER 24HR 60 MG: ORAL | 30 days supply | Qty: 60 | Fill #4 | Status: AC

## 2021-03-22 ENCOUNTER — Other Ambulatory Visit: Payer: Self-pay

## 2021-03-22 MED ORDER — MECLIZINE HCL 25 MG PO TABS
ORAL_TABLET | ORAL | 0 refills | Status: DC
  Filled 2021-03-22: qty 30, 10d supply, fill #0

## 2021-03-26 ENCOUNTER — Other Ambulatory Visit: Payer: Self-pay

## 2021-03-26 MED ORDER — CLOPIDOGREL BISULFATE 75 MG PO TABS
ORAL_TABLET | ORAL | 1 refills | Status: AC
  Filled 2021-04-02: qty 90, 90d supply, fill #0
  Filled 2021-07-02: qty 90, 90d supply, fill #1

## 2021-04-02 ENCOUNTER — Other Ambulatory Visit: Payer: Self-pay

## 2021-04-03 ENCOUNTER — Other Ambulatory Visit: Payer: Self-pay

## 2021-04-04 ENCOUNTER — Other Ambulatory Visit: Payer: Self-pay

## 2021-04-04 MED ORDER — ASPIRIN 81 MG PO CHEW
CHEWABLE_TABLET | ORAL | 3 refills | Status: AC
  Filled 2021-04-04: qty 90, 90d supply, fill #0

## 2021-04-09 ENCOUNTER — Other Ambulatory Visit: Payer: Self-pay

## 2021-04-09 MED FILL — Isosorbide Mononitrate Tab ER 24HR 60 MG: ORAL | 30 days supply | Qty: 60 | Fill #5 | Status: AC

## 2021-04-18 ENCOUNTER — Other Ambulatory Visit: Payer: Self-pay

## 2021-04-24 ENCOUNTER — Other Ambulatory Visit: Payer: Self-pay

## 2021-04-24 MED ORDER — MIRTAZAPINE 15 MG PO TABS
ORAL_TABLET | ORAL | 5 refills | Status: AC
Start: 2021-04-24 — End: ?
  Filled 2021-04-24: qty 30, 30d supply, fill #0

## 2021-04-29 ENCOUNTER — Other Ambulatory Visit: Payer: Self-pay

## 2021-05-07 ENCOUNTER — Other Ambulatory Visit: Payer: Self-pay

## 2021-05-07 MED ORDER — TRELEGY ELLIPTA 100-62.5-25 MCG/ACT IN AEPB
INHALATION_SPRAY | RESPIRATORY_TRACT | 5 refills | Status: AC
  Filled 2021-06-11: qty 60, 30d supply, fill #0

## 2021-05-07 MED ORDER — SERTRALINE HCL 50 MG PO TABS
50.0000 mg | ORAL_TABLET | Freq: Every day | ORAL | 3 refills | Status: AC
  Filled 2021-05-07 – 2021-06-24 (×2): qty 30, 30d supply, fill #0

## 2021-05-07 MED ORDER — AMLODIPINE BESYLATE 5 MG PO TABS
10.0000 mg | ORAL_TABLET | Freq: Every day | ORAL | 3 refills | Status: AC
  Filled 2021-05-07 – 2021-05-29 (×3): qty 180, 90d supply, fill #0

## 2021-05-13 ENCOUNTER — Other Ambulatory Visit: Payer: Self-pay

## 2021-05-14 ENCOUNTER — Other Ambulatory Visit: Payer: Self-pay

## 2021-05-15 ENCOUNTER — Other Ambulatory Visit: Payer: Self-pay

## 2021-05-17 ENCOUNTER — Telehealth: Payer: Self-pay | Admitting: Pharmacist

## 2021-05-17 NOTE — Telephone Encounter (Signed)
05/17/2021 9:50:51 AM - Trelegy to dr & pt -- Tarry Kos - Friday, May 17, 2021 9:45 AM -- Received printout from pharmacy for Trelegy Ellipta 100-62.5 replaces Anora. Patient Id# [id number] PAP forms mailed to Nurse, children's @ [facility] for signature. Forms also mailed to pt for signature and requesting POI & taxes.

## 2021-05-26 MED FILL — Isosorbide Mononitrate Tab ER 24HR 60 MG: ORAL | 90 days supply | Qty: 180 | Fill #6 | Status: AC

## 2021-05-27 ENCOUNTER — Other Ambulatory Visit: Payer: Self-pay

## 2021-05-29 ENCOUNTER — Other Ambulatory Visit: Payer: Self-pay

## 2021-06-03 ENCOUNTER — Other Ambulatory Visit: Payer: Self-pay

## 2021-06-03 MED ORDER — ANORO ELLIPTA 62.5-25 MCG/ACT IN AEPB
INHALATION_SPRAY | RESPIRATORY_TRACT | 5 refills | Status: DC
  Filled 2021-06-03: qty 60, 30d supply, fill #0

## 2021-06-03 MED ORDER — TRAZODONE HCL 100 MG PO TABS
ORAL_TABLET | ORAL | 3 refills | Status: AC
  Filled 2021-06-03: qty 45, 30d supply, fill #0
  Filled 2021-07-02: qty 45, 30d supply, fill #1

## 2021-06-04 ENCOUNTER — Other Ambulatory Visit: Payer: Self-pay

## 2021-06-05 ENCOUNTER — Other Ambulatory Visit: Payer: Self-pay

## 2021-06-06 ENCOUNTER — Other Ambulatory Visit: Payer: Self-pay

## 2021-06-07 ENCOUNTER — Telehealth: Payer: Self-pay | Admitting: Pharmacist

## 2021-06-07 NOTE — Telephone Encounter (Signed)
06/07/2021 12:12:29 PM - Trelegy Pending ?-- Tarry Kos - Friday, June 07, 2021 12:11 PM --  ?Received dr signed portion for Trelegy. Holding for pt signed portion, POI & taxes. ?

## 2021-06-11 ENCOUNTER — Other Ambulatory Visit: Payer: Self-pay

## 2021-06-12 ENCOUNTER — Telehealth: Payer: Self-pay | Admitting: Pharmacist

## 2021-06-12 ENCOUNTER — Other Ambulatory Visit: Payer: Self-pay

## 2021-06-12 NOTE — Telephone Encounter (Signed)
06/12/2021 11:56:23 AM - Dexilant refill called in ?-- Tarry Kos - Wednesday, June 12, 2021 11:47 AM --  ?Call [employer] for refill on Dexilant 60mg . Spoke to [place]. Allow 3-5 business days ?

## 2021-06-14 ENCOUNTER — Other Ambulatory Visit: Payer: Self-pay

## 2021-06-17 ENCOUNTER — Other Ambulatory Visit: Payer: Self-pay

## 2021-06-18 ENCOUNTER — Other Ambulatory Visit: Payer: Self-pay

## 2021-06-18 MED ORDER — ONDANSETRON 4 MG PO TBDP
ORAL_TABLET | ORAL | 0 refills | Status: AC
  Filled 2021-06-18: qty 21, 7d supply, fill #0

## 2021-06-18 MED ORDER — DOXEPIN HCL 10 MG PO CAPS
ORAL_CAPSULE | Freq: Every evening | ORAL | 0 refills | Status: AC
  Filled 2021-06-18: qty 30, 30d supply, fill #0

## 2021-06-18 MED ORDER — PANTOPRAZOLE SODIUM 40 MG PO TBEC
DELAYED_RELEASE_TABLET | Freq: Two times a day (BID) | ORAL | 3 refills | Status: AC
  Filled 2021-06-18: qty 180, 90d supply, fill #0

## 2021-06-19 ENCOUNTER — Other Ambulatory Visit: Payer: Self-pay

## 2021-06-24 ENCOUNTER — Telehealth: Payer: Self-pay | Admitting: Pharmacist

## 2021-06-24 ENCOUNTER — Other Ambulatory Visit: Payer: Self-pay

## 2021-06-24 NOTE — Telephone Encounter (Signed)
06/24/2021 11:25:29 AM - Trelegy Pending ?-- Tarry Kos - Monday, June 24, 2021 11:21 AM --Received patient signed portion for Trelegy. Holding for provider sign portion mailed on 05/17/2021 to [facility]. [facility], [address], [address], [place], [place] [address]. ?

## 2021-06-25 ENCOUNTER — Other Ambulatory Visit: Payer: Self-pay

## 2021-06-25 MED ORDER — CHOLESTYRAMINE 4 G PO PACK
1.0000 | PACK | Freq: Every day | ORAL | 11 refills | Status: AC
  Filled 2021-06-25: qty 60, 60d supply, fill #0

## 2021-06-26 ENCOUNTER — Other Ambulatory Visit: Payer: Self-pay

## 2021-07-01 ENCOUNTER — Other Ambulatory Visit: Payer: Self-pay

## 2021-07-02 ENCOUNTER — Other Ambulatory Visit: Payer: Self-pay

## 2021-07-02 MED ORDER — NITROGLYCERIN 0.4 MG SL SUBL
SUBLINGUAL_TABLET | SUBLINGUAL | 0 refills | Status: AC
  Filled 2021-07-02: qty 25, 25d supply, fill #0

## 2021-07-02 MED ORDER — CARVEDILOL 25 MG PO TABS
ORAL_TABLET | Freq: Two times a day (BID) | ORAL | 3 refills | Status: AC
  Filled 2021-07-02: qty 180, 90d supply, fill #0

## 2021-07-02 MED ORDER — MECLIZINE HCL 25 MG PO TABS
ORAL_TABLET | ORAL | 0 refills | Status: AC
  Filled 2021-07-02: qty 30, 10d supply, fill #0

## 2021-07-03 ENCOUNTER — Other Ambulatory Visit: Payer: Self-pay

## 2021-07-19 ENCOUNTER — Other Ambulatory Visit: Payer: Self-pay

## 2021-07-26 ENCOUNTER — Other Ambulatory Visit: Payer: Self-pay

## 2021-08-05 ENCOUNTER — Other Ambulatory Visit: Payer: Self-pay

## 2021-08-06 ENCOUNTER — Other Ambulatory Visit: Payer: Self-pay

## 2021-08-07 ENCOUNTER — Other Ambulatory Visit: Payer: Self-pay

## 2021-08-28 ENCOUNTER — Other Ambulatory Visit: Payer: Self-pay | Admitting: Pharmacy Technician

## 2021-08-28 ENCOUNTER — Other Ambulatory Visit: Payer: Self-pay

## 2021-08-28 NOTE — Patient Outreach (Signed)
Patient no longer lives in [place].  Is now living in [place].  Thomas Bautista Patient Advocate Specialist [facility]

## 2021-08-30 ENCOUNTER — Other Ambulatory Visit: Payer: Self-pay

## 2021-09-02 ENCOUNTER — Other Ambulatory Visit: Payer: Self-pay

## 2021-09-09 ENCOUNTER — Other Ambulatory Visit: Payer: Self-pay

## 2021-09-18 ENCOUNTER — Other Ambulatory Visit (HOSPITAL_COMMUNITY): Payer: Self-pay

## 2021-10-10 ENCOUNTER — Other Ambulatory Visit: Payer: Self-pay
